# Patient Record
Sex: Male | Born: 1965 | Race: White | Hispanic: No | Marital: Married | Smoking: Never smoker
Health system: Southern US, Community
[De-identification: ages and names within clinical notes are randomized; demographics above are authoritative.]

## PROBLEM LIST (undated history)

## (undated) DIAGNOSIS — E785 Hyperlipidemia, unspecified: Secondary | ICD-10-CM

## (undated) DIAGNOSIS — I2510 Atherosclerotic heart disease of native coronary artery without angina pectoris: Secondary | ICD-10-CM

## (undated) DIAGNOSIS — N63 Unspecified lump in unspecified breast: Secondary | ICD-10-CM

## (undated) HISTORY — DX: Atherosclerotic heart disease of native coronary artery without angina pectoris: I25.10

## (undated) HISTORY — DX: Unspecified lump in unspecified breast: N63.0

## (undated) HISTORY — DX: Hyperlipidemia, unspecified: E78.5

## (undated) SURGERY — Surgical Case
Anesthesia: *Unknown

---
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
## 1998-04-17 ENCOUNTER — Ambulatory Visit (HOSPITAL_COMMUNITY): Admission: RE | Admit: 1998-04-17 | Discharge: 1998-04-17 | Payer: Self-pay | Admitting: Gastroenterology

## 2003-03-27 ENCOUNTER — Encounter: Admission: RE | Admit: 2003-03-27 | Discharge: 2003-05-03 | Payer: Self-pay | Admitting: Family Medicine

## 2004-02-26 ENCOUNTER — Encounter: Admission: RE | Admit: 2004-02-26 | Discharge: 2004-02-26 | Payer: Self-pay | Admitting: Internal Medicine

## 2005-04-19 IMAGING — US US EXTREM LOW VENOUS*R*
1 series · 14 of 24 positions shown · non-contrast
Comparison: none

CLINICAL DATA: Right leg swelling and pain.
 ULTRASOUND VENOUS IMAGING OF RIGHT LEG
 Ultrasound of the right leg deep venous system was performed.  The right common femoral vein, superficial femoral vein, saphenous, popliteal, and posterior tibial veins all compress and augment normally.  There is no evidence of deep venous thrombosis.
 IMPRESSION
 No evidence of DVT.

[Series 1: unknown · 14 of 34 slices shown]
[im 1/34]
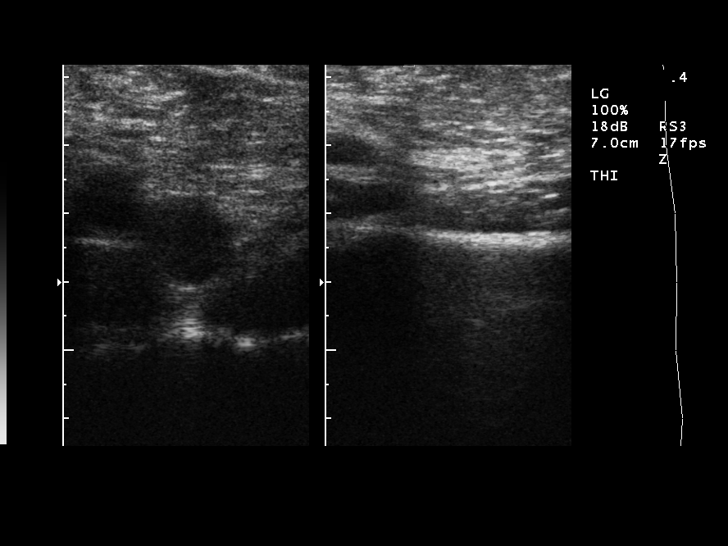
[im 3/34]
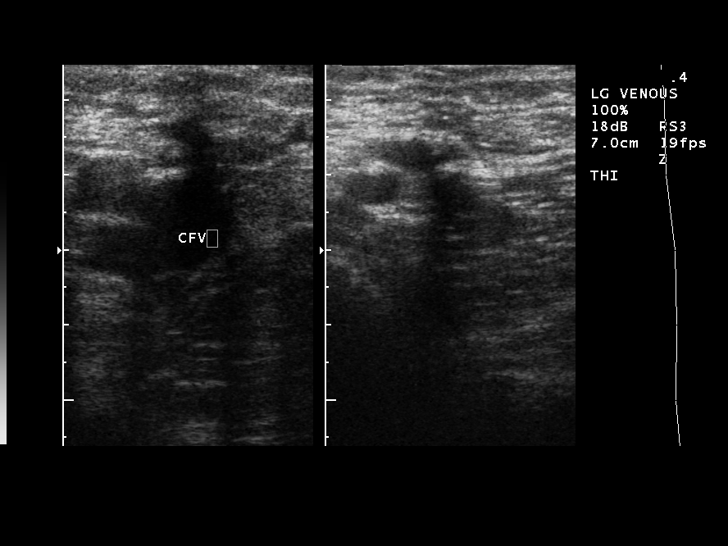
[im 6/34]
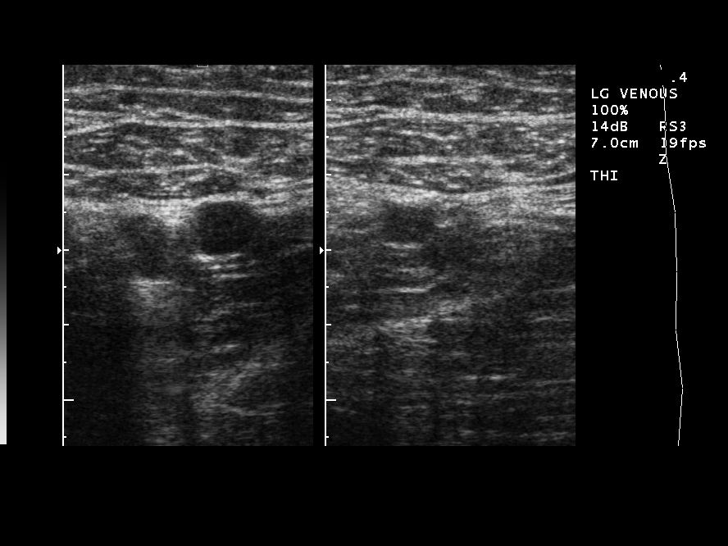
[im 9/34]
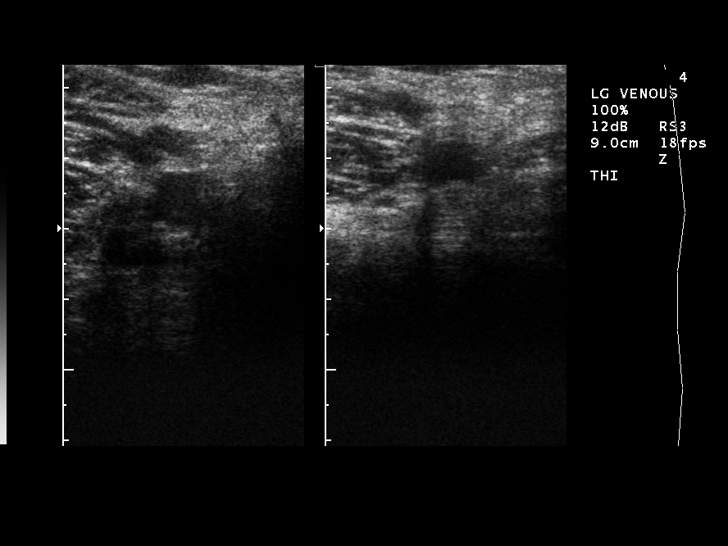
[im 11/34]
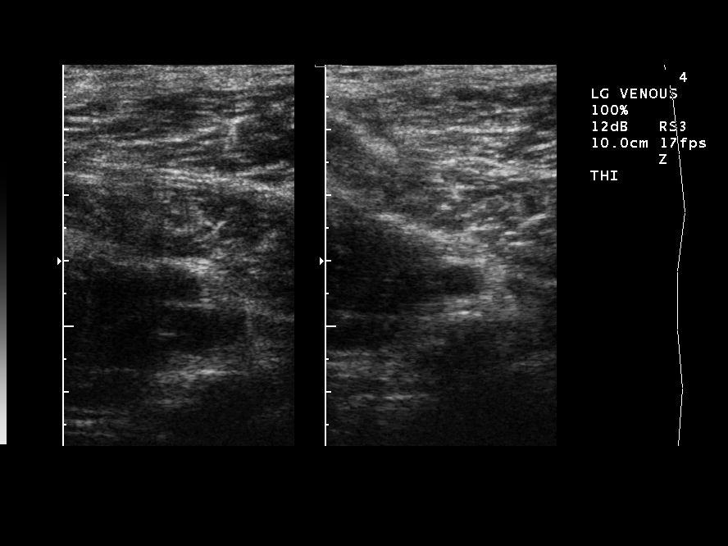
[im 13/34]
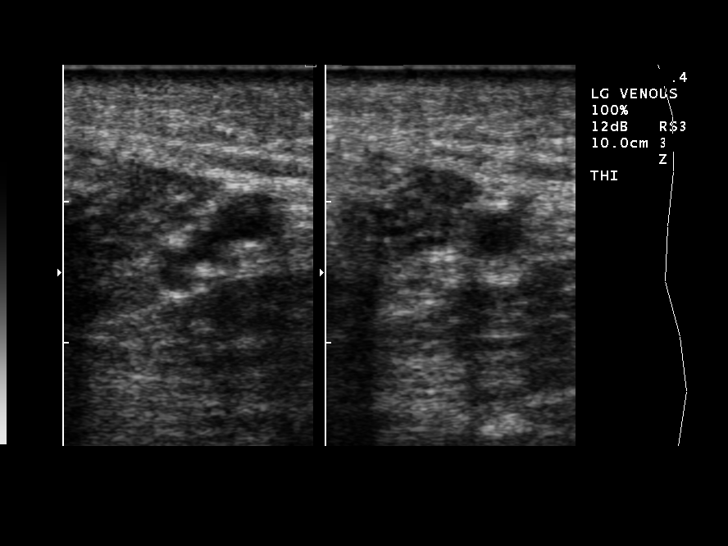
[im 16/34]
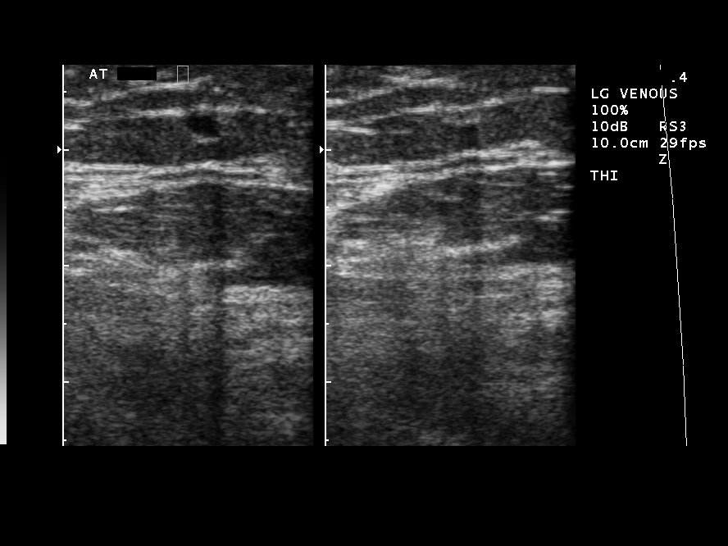
[im 18/34]
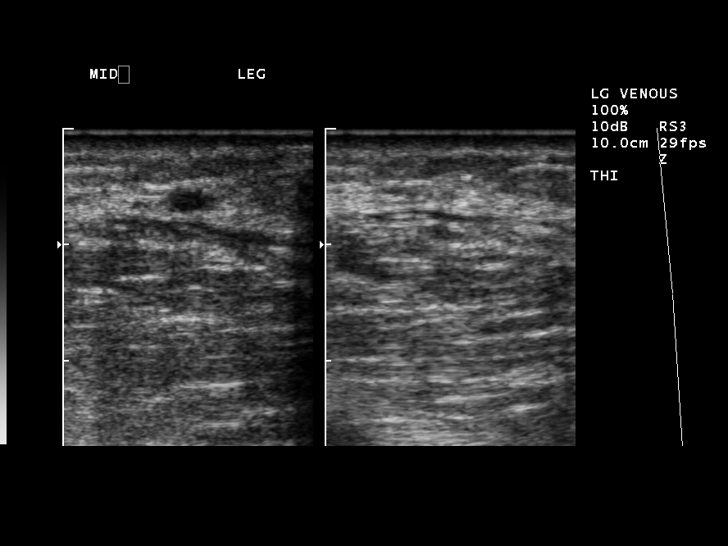
[im 21/34]
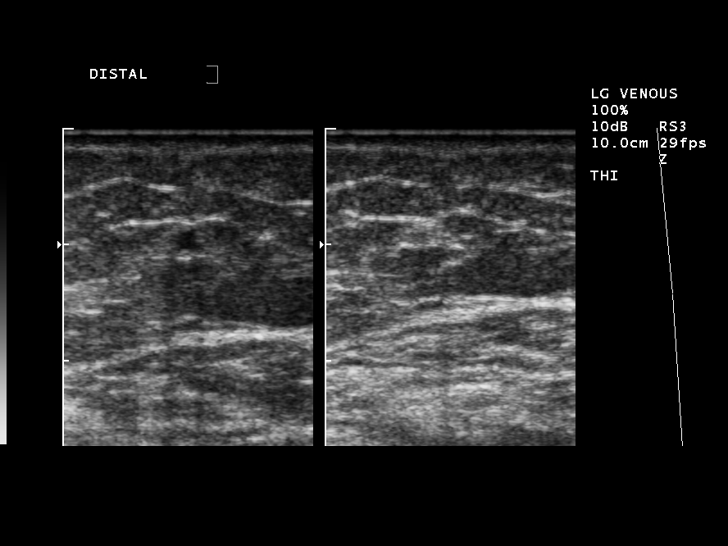
[im 23/34]
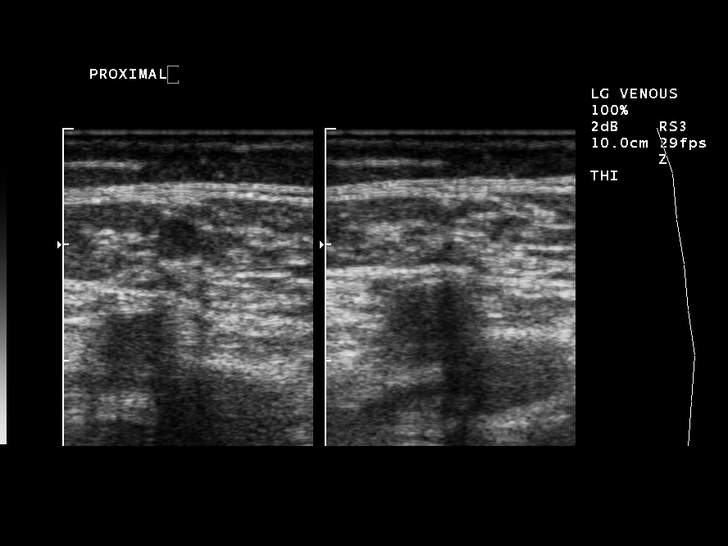
[im 26/34]
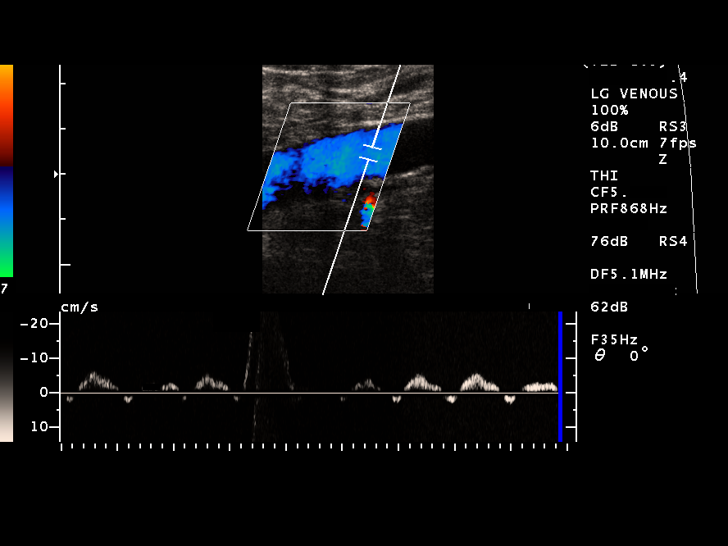
[im 28/34]
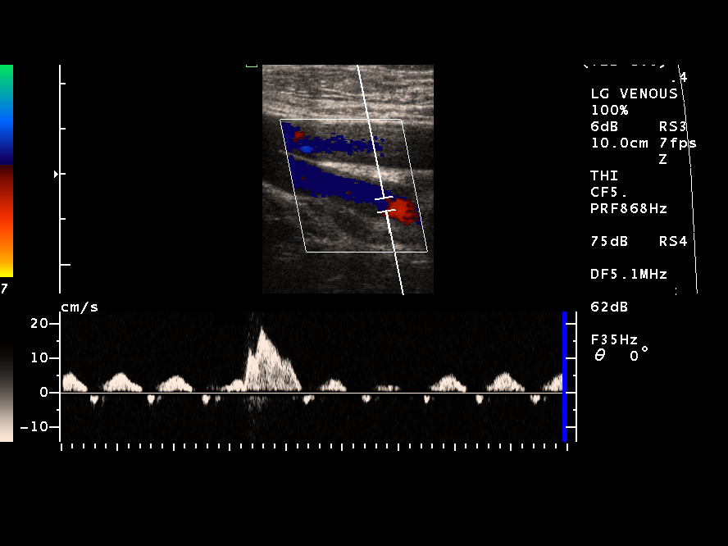
[im 31/34]
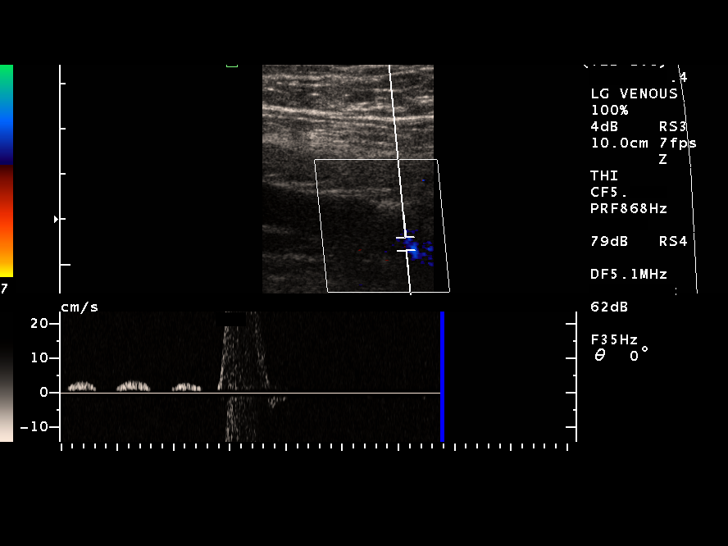
[im 34/34]
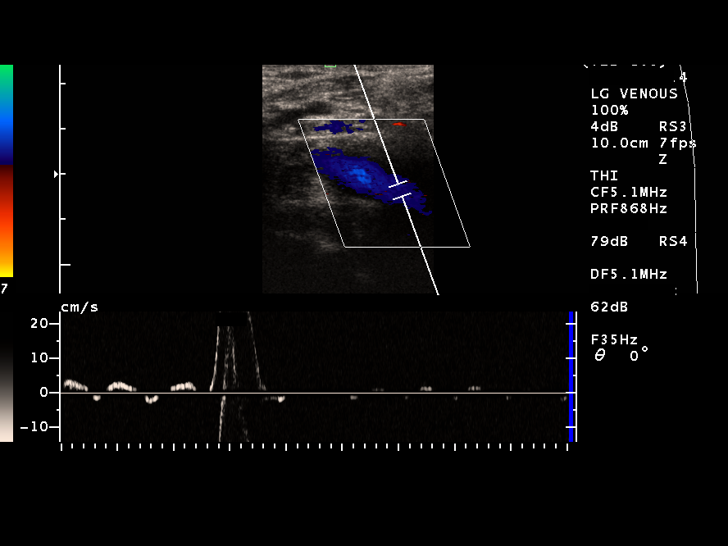

[14 of 24 positions shown; findings below may reference images not displayed]

## 2009-09-15 HISTORY — PX: GANGLION CYST EXCISION: SHX1691

## 2013-02-16 ENCOUNTER — Telehealth (INDEPENDENT_AMBULATORY_CARE_PROVIDER_SITE_OTHER): Payer: Self-pay

## 2013-02-16 NOTE — Telephone Encounter (Signed)
The pt called regarding his upcoming appointment on 6/19.  He was told to call back if he needs a referral for a ct or test.  He is self referred for a lump near his rib cage.  I told him we can't refer him for testing until we see him.  We don't know what the dr will want.  He needs to call his medical md to ask him that or he can wait and be seen here 1st.  We can go from there.  He agrees to wait.

## 2013-03-03 ENCOUNTER — Encounter (INDEPENDENT_AMBULATORY_CARE_PROVIDER_SITE_OTHER): Payer: Self-pay | Admitting: General Surgery

## 2013-03-03 ENCOUNTER — Ambulatory Visit (INDEPENDENT_AMBULATORY_CARE_PROVIDER_SITE_OTHER): Payer: 59 | Admitting: General Surgery

## 2013-03-03 VITALS — BP 148/100 | HR 72 | Temp 99.2°F | Resp 16 | Ht 67.0 in | Wt 186.0 lb

## 2013-03-03 DIAGNOSIS — D179 Benign lipomatous neoplasm, unspecified: Secondary | ICD-10-CM

## 2013-03-03 NOTE — Patient Instructions (Signed)

## 2013-03-03 NOTE — Progress Notes (Signed)
Chief complaint: Lump left chest  History: Patient come to the office self-referred, his mother a former patient of mine. For about 9 months he has noted a gradually enlarging lump over his left costal margin. It has enlarged minimally. It gives him mild discomfort occasionally when he lies against it. He's not sure it is changed much in the last couple of months. He has no other similar areas.  Past Medical History  Diagnosis Date  . Breast mass     left   Past Surgical History  Procedure Laterality Date  . Anterior cruciate ligament repair  1986    left  . Ganglion cyst excision  2011    right wrist   No current outpatient prescriptions on file.   No current facility-administered medications for this visit.   No Known Allergies  Exam: BP 148/100  Pulse 72  Temp(Src) 99.2 F (37.3 C) (Oral)  Resp 16  Ht 5\' 7"  (1.702 m)  Wt 186 lb (84.369 kg)  BMI 29.12 kg/m2 General: Healthy-appearing male no distress Lungs: Clear equal breath sounds Cardiac: Regular rate and rhythm. No murmurs. Chest wall: Overlying the anterior lateral left costal margin is approximately 4 x 2 cm soft discrete freely movable rubbery mass entirely consistent with a lipoma.  Assessment and plan: Lipoma left costal margin. This appears to be mildly symptomatic and slightly enlarged. We discussed options including excision or observation. I think he of course is reasonable. He will think this over. Right now he's not sure that is bothering him enough and I think observation is safe. I stressed to him that if he notices any definite enlargement or increasing discomfort he should return for evaluation.

## 2023-03-12 DIAGNOSIS — I213 ST elevation (STEMI) myocardial infarction of unspecified site: Secondary | ICD-10-CM

## 2023-03-12 HISTORY — DX: ST elevation (STEMI) myocardial infarction of unspecified site: I21.3

## 2023-03-13 ENCOUNTER — Encounter (HOSPITAL_BASED_OUTPATIENT_CLINIC_OR_DEPARTMENT_OTHER): Payer: Self-pay

## 2023-03-13 ENCOUNTER — Encounter (HOSPITAL_COMMUNITY)
Admission: EM | Disposition: A | Discharge status: Home / Self Care | Payer: Self-pay | Source: Home / Self Care | Attending: Cardiovascular Disease

## 2023-03-13 ENCOUNTER — Inpatient Hospital Stay (HOSPITAL_BASED_OUTPATIENT_CLINIC_OR_DEPARTMENT_OTHER)
Admission: EM | Admit: 2023-03-13 | Discharge: 2023-03-15 | DRG: 322 | Disposition: A | Discharge status: Home / Self Care | Payer: 59 | Attending: Cardiovascular Disease | Admitting: Cardiovascular Disease

## 2023-03-13 ENCOUNTER — Other Ambulatory Visit (HOSPITAL_COMMUNITY): Payer: Self-pay

## 2023-03-13 ENCOUNTER — Other Ambulatory Visit: Payer: Self-pay

## 2023-03-13 DIAGNOSIS — Z8249 Family history of ischemic heart disease and other diseases of the circulatory system: Secondary | ICD-10-CM | POA: Diagnosis not present

## 2023-03-13 DIAGNOSIS — Z955 Presence of coronary angioplasty implant and graft: Secondary | ICD-10-CM

## 2023-03-13 DIAGNOSIS — Z79899 Other long term (current) drug therapy: Secondary | ICD-10-CM | POA: Diagnosis not present

## 2023-03-13 DIAGNOSIS — I213 ST elevation (STEMI) myocardial infarction of unspecified site: Principal | ICD-10-CM

## 2023-03-13 DIAGNOSIS — I2510 Atherosclerotic heart disease of native coronary artery without angina pectoris: Secondary | ICD-10-CM | POA: Diagnosis not present

## 2023-03-13 DIAGNOSIS — I2119 ST elevation (STEMI) myocardial infarction involving other coronary artery of inferior wall: Secondary | ICD-10-CM | POA: Diagnosis present

## 2023-03-13 DIAGNOSIS — R079 Chest pain, unspecified: Secondary | ICD-10-CM | POA: Diagnosis not present

## 2023-03-13 DIAGNOSIS — I2111 ST elevation (STEMI) myocardial infarction involving right coronary artery: Secondary | ICD-10-CM | POA: Diagnosis not present

## 2023-03-13 HISTORY — PX: CORONARY/GRAFT ACUTE MI REVASCULARIZATION: CATH118305

## 2023-03-13 HISTORY — PX: LEFT HEART CATH AND CORONARY ANGIOGRAPHY: CATH118249

## 2023-03-13 LAB — CBC WITH DIFFERENTIAL/PLATELET
Abs Immature Granulocytes: 0.02 10*3/uL (ref 0.00–0.07)
Basophils Absolute: 0.1 10*3/uL (ref 0.0–0.1)
Basophils Relative: 1 %
Eosinophils Absolute: 0.2 10*3/uL (ref 0.0–0.5)
Eosinophils Relative: 2 %
HCT: 47.9 % (ref 39.0–52.0)
Hemoglobin: 17.2 g/dL — ABNORMAL HIGH (ref 13.0–17.0)
Immature Granulocytes: 0 %
Lymphocytes Relative: 28 %
Lymphs Abs: 2.9 10*3/uL (ref 0.7–4.0)
MCH: 32.1 pg (ref 26.0–34.0)
MCHC: 35.9 g/dL (ref 30.0–36.0)
MCV: 89.5 fL (ref 80.0–100.0)
Monocytes Absolute: 0.8 10*3/uL (ref 0.1–1.0)
Monocytes Relative: 8 %
Neutro Abs: 6.2 10*3/uL (ref 1.7–7.7)
Neutrophils Relative %: 61 %
Platelets: 306 10*3/uL (ref 150–400)
RBC: 5.35 MIL/uL (ref 4.22–5.81)
RDW: 11.7 % (ref 11.5–15.5)
WBC: 10.2 10*3/uL (ref 4.0–10.5)
nRBC: 0 % (ref 0.0–0.2)

## 2023-03-13 LAB — POCT ACTIVATED CLOTTING TIME
Activated Clotting Time: 415 seconds
Activated Clotting Time: 415 seconds

## 2023-03-13 LAB — COMPREHENSIVE METABOLIC PANEL
ALT: 23 U/L (ref 0–44)
AST: 27 U/L (ref 15–41)
Albumin: 4.7 g/dL (ref 3.5–5.0)
Alkaline Phosphatase: 67 U/L (ref 38–126)
Anion gap: 10 (ref 5–15)
BUN: 14 mg/dL (ref 6–20)
CO2: 27 mmol/L (ref 22–32)
Calcium: 10.1 mg/dL (ref 8.9–10.3)
Chloride: 102 mmol/L (ref 98–111)
Creatinine, Ser: 1.13 mg/dL (ref 0.61–1.24)
GFR, Estimated: >60 mL/min (ref >60)
Glucose, Bld: 148 mg/dL — ABNORMAL HIGH (ref 70–99)
Potassium: 3.8 mmol/L (ref 3.5–5.1)
Sodium: 139 mmol/L (ref 135–145)
Total Bilirubin: 0.7 mg/dL (ref 0.3–1.2)
Total Protein: 7.6 g/dL (ref 6.5–8.1)

## 2023-03-13 LAB — LIPID PANEL
Cholesterol: 225 mg/dL — ABNORMAL HIGH (ref 0–200)
HDL: 47 mg/dL (ref >40)
LDL Cholesterol: 153 mg/dL — ABNORMAL HIGH (ref 0–99)
Total CHOL/HDL Ratio: 4.8 RATIO
Triglycerides: 125 mg/dL (ref <150)
VLDL: 25 mg/dL (ref 0–40)

## 2023-03-13 LAB — APTT: aPTT: 29 seconds (ref 24–36)

## 2023-03-13 LAB — TROPONIN I (HIGH SENSITIVITY)
Troponin I (High Sensitivity): 306 ng/L (ref <18)
Troponin I (High Sensitivity): 481 ng/L (ref <18)

## 2023-03-13 LAB — PROTIME-INR
INR: 1.1 (ref 0.8–1.2)
Prothrombin Time: 14.2 seconds (ref 11.4–15.2)

## 2023-03-13 LAB — MRSA NEXT GEN BY PCR, NASAL: MRSA by PCR Next Gen: NOT DETECTED

## 2023-03-13 SURGERY — CORONARY/GRAFT ACUTE MI REVASCULARIZATION
Anesthesia: LOCAL

## 2023-03-13 MED ORDER — PRASUGREL HCL 10 MG PO TABS
60.0000 mg | ORAL_TABLET | Freq: Once | ORAL | Status: AC
  Administered 2023-03-13: 60 mg via ORAL
  Filled 2023-03-13: qty 6

## 2023-03-13 MED ORDER — HEPARIN SODIUM (PORCINE) 5000 UNIT/ML IJ SOLN
4000.0000 [IU] | Freq: Once | INTRAMUSCULAR | Status: AC
  Administered 2023-03-13: 4000 [IU] via INTRAVENOUS
  Filled 2023-03-13: qty 1

## 2023-03-13 MED ORDER — MORPHINE SULFATE (PF) 2 MG/ML IV SOLN
2.0000 mg | INTRAVENOUS | Status: DC | PRN
  Administered 2023-03-13: 2 mg via INTRAVENOUS
  Filled 2023-03-13: qty 1

## 2023-03-13 MED ORDER — SODIUM CHLORIDE 0.9 % IV SOLN: INTRAVENOUS | Status: DC

## 2023-03-13 MED ORDER — TIROFIBAN HCL IN NACL 5-0.9 MG/100ML-% IV SOLN
0.1500 ug/kg/min | INTRAVENOUS | Status: AC
  Filled 2023-03-13: qty 100

## 2023-03-13 MED ORDER — MORPHINE SULFATE (PF) 4 MG/ML IV SOLN
4.0000 mg | Freq: Once | INTRAVENOUS | Status: AC
  Administered 2023-03-13: 4 mg via INTRAVENOUS
  Filled 2023-03-13: qty 1

## 2023-03-13 MED ORDER — ATORVASTATIN CALCIUM 80 MG PO TABS
80.0000 mg | ORAL_TABLET | Freq: Every day | ORAL | Status: DC
  Administered 2023-03-13 – 2023-03-14 (×2): 80 mg via ORAL
  Filled 2023-03-13 (×2): qty 1

## 2023-03-13 MED ORDER — TICAGRELOR 90 MG PO TABS
ORAL_TABLET | ORAL | Status: AC
  Filled 2023-03-13: qty 2

## 2023-03-13 MED ORDER — HYDRALAZINE HCL 20 MG/ML IJ SOLN: 10.0000 mg | INTRAMUSCULAR | Status: AC | PRN

## 2023-03-13 MED ORDER — MIDAZOLAM HCL 2 MG/2ML IJ SOLN
INTRAMUSCULAR | Status: DC | PRN
  Administered 2023-03-13: 2 mg via INTRAVENOUS

## 2023-03-13 MED ORDER — ACETAMINOPHEN 325 MG PO TABS: 650.0000 mg | ORAL_TABLET | ORAL | Status: DC | PRN

## 2023-03-13 MED ORDER — SODIUM CHLORIDE 0.9 % IV SOLN: 250.0000 mL | INTRAVENOUS | Status: DC | PRN

## 2023-03-13 MED ORDER — IOHEXOL 350 MG/ML SOLN
INTRAVENOUS | Status: DC | PRN
  Administered 2023-03-13: 80 mL

## 2023-03-13 MED ORDER — CHLORHEXIDINE GLUCONATE CLOTH 2 % EX PADS
6.0000 | MEDICATED_PAD | Freq: Every day | CUTANEOUS | Status: DC
  Administered 2023-03-13 – 2023-03-14 (×2): 6 via TOPICAL

## 2023-03-13 MED ORDER — HEPARIN (PORCINE) IN NACL 1000-0.9 UT/500ML-% IV SOLN
INTRAVENOUS | Status: DC | PRN
  Administered 2023-03-13 (×2): 500 mL

## 2023-03-13 MED ORDER — LIDOCAINE HCL (PF) 1 % IJ SOLN
INTRAMUSCULAR | Status: DC | PRN
  Administered 2023-03-13: 2 mL

## 2023-03-13 MED ORDER — TIROFIBAN (AGGRASTAT) BOLUS VIA INFUSION
INTRAVENOUS | Status: DC | PRN
  Administered 2023-03-13: 2075 ug via INTRAVENOUS

## 2023-03-13 MED ORDER — VERAPAMIL HCL 2.5 MG/ML IV SOLN
INTRAVENOUS | Status: AC
  Filled 2023-03-13: qty 2

## 2023-03-13 MED ORDER — TICAGRELOR 90 MG PO TABS: 90.0000 mg | ORAL_TABLET | Freq: Two times a day (BID) | ORAL | Status: DC

## 2023-03-13 MED ORDER — HEPARIN SODIUM (PORCINE) 1000 UNIT/ML IJ SOLN
INTRAMUSCULAR | Status: DC | PRN
  Administered 2023-03-13: 10000 [IU] via INTRAVENOUS

## 2023-03-13 MED ORDER — LABETALOL HCL 5 MG/ML IV SOLN: 10.0000 mg | INTRAVENOUS | Status: AC | PRN

## 2023-03-13 MED ORDER — FENTANYL CITRATE (PF) 100 MCG/2ML IJ SOLN
INTRAMUSCULAR | Status: AC
  Filled 2023-03-13: qty 2

## 2023-03-13 MED ORDER — VERAPAMIL HCL 2.5 MG/ML IV SOLN
INTRAVENOUS | Status: DC | PRN
  Administered 2023-03-13: 10 mL via INTRA_ARTERIAL

## 2023-03-13 MED ORDER — TIROFIBAN HCL IN NACL 5-0.9 MG/100ML-% IV SOLN
INTRAVENOUS | Status: AC
  Filled 2023-03-13: qty 100

## 2023-03-13 MED ORDER — MIDAZOLAM HCL 2 MG/2ML IJ SOLN
INTRAMUSCULAR | Status: AC
  Filled 2023-03-13: qty 2

## 2023-03-13 MED ORDER — HEPARIN SODIUM (PORCINE) 1000 UNIT/ML IJ SOLN
INTRAMUSCULAR | Status: AC
  Filled 2023-03-13: qty 10

## 2023-03-13 MED ORDER — TICAGRELOR 90 MG PO TABS
ORAL_TABLET | ORAL | Status: DC | PRN
  Administered 2023-03-13: 180 mg via ORAL

## 2023-03-13 MED ORDER — OXYCODONE HCL 5 MG PO TABS: 5.0000 mg | ORAL_TABLET | ORAL | Status: DC | PRN

## 2023-03-13 MED ORDER — ASPIRIN 81 MG PO CHEW
324.0000 mg | CHEWABLE_TABLET | Freq: Once | ORAL | Status: AC
  Administered 2023-03-13: 324 mg via ORAL
  Filled 2023-03-13: qty 4

## 2023-03-13 MED ORDER — LIDOCAINE HCL (PF) 1 % IJ SOLN
INTRAMUSCULAR | Status: AC
  Filled 2023-03-13: qty 30

## 2023-03-13 MED ORDER — SODIUM CHLORIDE 0.9% FLUSH: 3.0000 mL | INTRAVENOUS | Status: DC | PRN

## 2023-03-13 MED ORDER — ASPIRIN 81 MG PO CHEW
81.0000 mg | CHEWABLE_TABLET | Freq: Every day | ORAL | Status: DC
  Administered 2023-03-14: 81 mg via ORAL
  Filled 2023-03-13: qty 1

## 2023-03-13 MED ORDER — PRASUGREL HCL 10 MG PO TABS
10.0000 mg | ORAL_TABLET | Freq: Every day | ORAL | Status: DC
  Administered 2023-03-14: 10 mg via ORAL
  Filled 2023-03-13 (×2): qty 1

## 2023-03-13 MED ORDER — TIROFIBAN HCL IN NACL 5-0.9 MG/100ML-% IV SOLN
INTRAVENOUS | Status: DC | PRN
  Administered 2023-03-13: .15 ug/kg/min via INTRAVENOUS

## 2023-03-13 MED ORDER — FENTANYL CITRATE (PF) 100 MCG/2ML IJ SOLN
INTRAMUSCULAR | Status: DC | PRN
  Administered 2023-03-13: 50 ug via INTRAVENOUS

## 2023-03-13 MED ORDER — SODIUM CHLORIDE 0.9 % IV SOLN: INTRAVENOUS | Status: AC

## 2023-03-13 MED ORDER — ONDANSETRON HCL 4 MG/2ML IJ SOLN: 4.0000 mg | Freq: Four times a day (QID) | INTRAMUSCULAR | Status: DC | PRN

## 2023-03-13 MED ORDER — ORAL CARE MOUTH RINSE: 15.0000 mL | OROMUCOSAL | Status: DC | PRN

## 2023-03-13 MED ORDER — SODIUM CHLORIDE 0.9% FLUSH
3.0000 mL | Freq: Two times a day (BID) | INTRAVENOUS | Status: DC
  Administered 2023-03-14 (×2): 3 mL via INTRAVENOUS

## 2023-03-13 SURGICAL SUPPLY — 19 items
BALLN EMERGE MR 2.5X12 (BALLOONS) ×1
BALLN ~~LOC~~ EMERGE MR 3.0X20 (BALLOONS) ×1
BALLOON EMERGE MR 2.5X12 (BALLOONS) IMPLANT
BALLOON ~~LOC~~ EMERGE MR 3.0X20 (BALLOONS) IMPLANT
CATH INFINITI 5 FR JL3.5 (CATHETERS) IMPLANT
CATH INFINITI 5FR ANG PIGTAIL (CATHETERS) IMPLANT
CATH LAUNCHER 6FR JR4 (CATHETERS) IMPLANT
DEVICE RAD COMP TR BAND LRG (VASCULAR PRODUCTS) IMPLANT
GLIDESHEATH SLEND SS 6F .021 (SHEATH) IMPLANT
GUIDEWIRE INQWIRE 1.5J.035X260 (WIRE) IMPLANT
INQWIRE 1.5J .035X260CM (WIRE) ×1
KIT ENCORE 26 ADVANTAGE (KITS) IMPLANT
KIT HEART LEFT (KITS) ×1 IMPLANT
PACK CARDIAC CATHETERIZATION (CUSTOM PROCEDURE TRAY) ×1 IMPLANT
STENT SYNERGY XD 2.75X38 (Permanent Stent) IMPLANT
SYNERGY XD 2.75X38 (Permanent Stent) ×1 IMPLANT
TRANSDUCER W/STOPCOCK (MISCELLANEOUS) ×1 IMPLANT
TUBING CIL FLEX 10 FLL-RA (TUBING) ×1 IMPLANT
WIRE COUGAR XT STRL 190CM (WIRE) IMPLANT

## 2023-03-13 NOTE — ED Notes (Addendum)
Pt placed on 2L [place] per cath lab.

## 2023-03-13 NOTE — TOC Initial Note (Signed)
Transition of Care [facility]) - Initial/Assessment Note    Patient Details  Name: Brent Carr MRN: [id number] Date of Birth: [birth year]  [facility]) CM/SW Contact:    Elliot Cousin, RN Phone Number: [phone] 03/13/2023, 4:32 PM  Clinical Narrative:  TOC CM spoke to pt and wife at bedside. Pt wanted to scheduled new PCP at [facility]. Contacted office and left message with new patient coordinator to schedule appt. Pt states he does not need note for work.                  Expected Discharge Plan: Home/Self Care Barriers to Discharge: Continued Medical Work up   Patient Goals and CMS Choice Patient states their goals for this hospitalization and ongoing recovery are:: wants to recover          Expected Discharge Plan and Services   Discharge Planning Services: CM Consult   Living arrangements for the past 2 months: Single Family Home                                      Prior Living Arrangements/Services Living arrangements for the past 2 months: Single Family Home Lives with:: Spouse Patient language and need for interpreter reviewed:: Yes Do you feel safe going back to the place where you live?: Yes      Need for Family Participation in Patient Care: No (Comment) Care giver support system in place?: Yes (comment)   Criminal Activity/Legal Involvement Pertinent to Current Situation/Hospitalization: No - Comment as needed  Activities of Daily Living Home Assistive Devices/Equipment: Environmental consultant (specify type), Wheelchair, Shower chair with back (Rolator and regular walker) ADL Screening (condition at time of admission) Patient's cognitive ability adequate to safely complete daily activities?: Yes Is the patient deaf or have difficulty hearing?: No Does the patient have difficulty seeing, even when wearing glasses/contacts?: No Does the patient have difficulty concentrating, remembering, or making decisions?: No Patient able to  express need for assistance with ADLs?: Yes Does the patient have difficulty dressing or bathing?: No Independently performs ADLs?: Yes (appropriate for developmental age) Does the patient have difficulty walking or climbing stairs?: No Weakness of Legs: None Weakness of Arms/Hands: None  Permission Sought/Granted Permission sought to share information with : Case Manager, Family Supports Permission granted to share information with : Yes, Verbal Permission Granted  Share Information with NAME: Erich Ricke     Permission granted to share info w Relationship: wife  Permission granted to share info w Contact Information: [phone]  Emotional Assessment Appearance:: Appears stated age Attitude/Demeanor/Rapport: Engaged   Orientation: : Oriented to Self, Oriented to Place, Oriented to  Time, Oriented to Situation   Psych Involvement: No (comment)  Admission diagnosis:  ST elevation myocardial infarction (STEMI), unspecified artery (HCC) [I21.3] Acute ST elevation myocardial infarction (STEMI) of inferior wall (HCC) [I21.19] Patient Active Problem List   Diagnosis Date Noted   Acute ST elevation myocardial infarction (STEMI) of inferior wall (HCC) 03/13/2023   PCP:  Pcp, No Pharmacy:   [id number] [place], [place] - [address] [place] [address] Phone: [phone] Fax: [phone]     Social Determinants of Health (SDOH) Social History: SDOH Screenings   Food Insecurity: No Food Insecurity (03/13/2023)  Housing: Low Risk  (03/13/2023)  Transportation Needs: No Transportation Needs (03/13/2023)  Utilities: Not At Risk (03/13/2023)  Tobacco Use: Unknown (  03/13/2023)   SDOH Interventions:     Readmission Risk Interventions     No data to display

## 2023-03-13 NOTE — ED Notes (Signed)
RT placed patient on 2lpm [place] , orders from physician. Patient saturation 100% 2lpm [place].

## 2023-03-13 NOTE — H&P (Addendum)
Cardiology Admission History and Physical   Patient ID: Brent Carr MRN: [id number]; DOB: Oct 08, 1965   Admission date: 03/13/2023  PCP:  Oneita Hurt, No   [facility] Providers Cardiologist:  None        Chief Complaint:  chest pain  Patient Profile:   Brent Carr is a 57 y.o. male with no significant medical history who is being seen 03/13/2023 for the evaluation of inferior STEMI.  History of Present Illness:   Mr. Valenzano presented to [facility] ED this morning for evaluation of chest pain that first began this past Tuesday 6/25. Pain was initially intermittent but became persistent and more severe this morning with radiation to the left shoulder and arm. Pain this morning occurred while at rest and severity was enough to prompt evaluation in the ED.   Initial ECG at [facility] showed ST elevation in leads II, III, AVF with ST segment depression in high latera leads I and AVL. STEMI alert called and patient transferred to [facility] for urgent LHC. Prior to departure from [facility], patient received heparin bolus, morphine, and ASA.   Upon arrival to the cath lab, patient with resolution of ST segment changes and is currently chest pain free. A brief review of history with patient confirms no major medical history. Has had prior ACL reconstruction. Patient does report significant family hx CAD with both his brother and father s/p CABG.    Past Medical History:  Diagnosis Date   Breast mass    left    Past Surgical History:  Procedure Laterality Date   ANTERIOR CRUCIATE LIGAMENT REPAIR  1986   left   GANGLION CYST EXCISION  2011   right wrist     Medications Prior to Admission: Prior to Admission medications   Not on File     Allergies:   No Known Allergies  Social History:   Social History   Socioeconomic History   Marital status: Married    Spouse name: Not on file   Number of children: Not on file   Years of education: Not on file   Highest education  level: Not on file  Occupational History   Not on file  Tobacco Use   Smoking status: Never   Smokeless tobacco: Not on file  Substance and Sexual Activity   Alcohol use: No   Drug use: No   Sexual activity: Not on file  Other Topics Concern   Not on file  Social History Narrative   Not on file   Social Determinants of Health   Financial Resource Strain: Not on file  Food Insecurity: Not on file  Transportation Needs: Not on file  Physical Activity: Not on file  Stress: Not on file  Social Connections: Not on file  Intimate Partner Violence: Not on file    Family History:  The patient's family history includes Cancer in his mother; Heart disease in his brother and father.    ROS:  Please see the history of present illness.  All other ROS reviewed and negative.     Physical Exam/Data:   Vitals:   03/13/23 1150 03/13/23 1156 03/13/23 1200 03/13/23 1236  BP: (!) 161/98  (!) 165/102   Pulse: 75  72   Resp: 15  12   Temp:      TempSrc:      SpO2: 98% 100% 100% 99%  Weight:      Height:       No intake or output data in the  24 hours ending 03/13/23 1301    03/13/2023   11:35 AM 03/03/2013    9:15 AM  Last 3 Weights  Weight (lbs) 182 lb 14.4 oz 186 lb  Weight (kg) 82.963 kg 84.369 kg     Body mass index is 28.65 kg/m.   Physical exam per attending provider.   EKG:  The ECG that was done in triage at [facility] was personally reviewed and demonstrates ST segment elevation leads II, III, AVF with reciprocal depression in lead I and AVL. Subsequent ECG with [facility] shows resolution of ST segment elevation.  Relevant CV Studies: N/A  Laboratory Data:  High Sensitivity Troponin:   Recent Labs  Lab 03/13/23 1137  TROPONINIHS 306*      Chemistry Recent Labs  Lab 03/13/23 1137  NA 139  K 3.8  CL 102  CO2 27  GLUCOSE 148*  BUN 14  CREATININE 1.13  CALCIUM 10.1  GFRNONAA >60  ANIONGAP 10    Recent Labs  Lab 03/13/23 1137  PROT 7.6  ALBUMIN 4.7  AST  27  ALT 23  ALKPHOS 67  BILITOT 0.7   Lipids No results for input(s): "CHOL", "TRIG", "HDL", "LABVLDL", "LDLCALC", "CHOLHDL" in the last 168 hours. Hematology Recent Labs  Lab 03/13/23 1137  WBC 10.2  RBC 5.35  HGB 17.2*  HCT 47.9  MCV 89.5  MCH 32.1  MCHC 35.9  RDW 11.7  PLT 306   Thyroid No results for input(s): "TSH", "FREET4" in the last 168 hours. BNPNo results for input(s): "BNP", "PROBNP" in the last 168 hours.  DDimer No results for input(s): "DDIMER" in the last 168 hours.   Radiology/Studies:  No results found.   Assessment and Plan:   Inferior STEMI  Patient with intermittent chest pain starting Tuesday 6/25. This morning, pain became persistent and radiated into left arm prompting ED visit. Triage ECG with ST segment elevation in inferior leads II, III, AVF and reciprocal depression in I and AVL. Patient received heparin, morphine, and ASA and STEMI alert activated.   Upon arrival to the cath lab, patient chest pain free with resolution of ST elevation.  Urgent LHC with Dr. Clifton James found mid-RCA with 99% occlusion. DES successfully placed.  Patient loaded on Brilinta, will continue with daily DAPT Brilinta/ASA. Aggrastat x2 hours post intervention Initiate high intensity statin. Check lipid panel and lipoprotein A Check A1c Echocardiogram ordered   Risk Assessment/Risk Scores:    TIMI Risk Score for ST  Elevation MI:   The patient's TIMI risk score is 1, which indicates a 1.6% risk of all cause mortality at 30 days.        Severity of Illness: The appropriate patient status for this patient is OBSERVATION. Observation status is judged to be reasonable and necessary in order to provide the required intensity of service to ensure the patient's safety. The patient's presenting symptoms, physical exam findings, and initial radiographic and laboratory data in the context of their medical condition is felt to place them at decreased risk for further  clinical deterioration. Furthermore, it is anticipated that the patient will be medically stable for discharge from the hospital within 2 midnights of admission.    For questions or updates, please contact [facility] Please consult [email or web address] for contact info under     Signed, Perlie Gold, PA-C  03/13/2023 1:01 PM   I have personally seen and examined this patient. I agree with the assessment and plan as outlined above.  57 yo male with no prior  medical history who came into the ED with chest pain. EKG with inferior ST elevation. Code STEMI called. Cath with severe distal RCA stenosis. One stent placed in the distal RCA.  Admit to ICU. DAPT with ASA/Brilinta for one year. High intensity statin.  Echo tomorrow. Beta blocker if LVEF down.   Verne Carrow, MD, [facility] 03/13/2023 1:32 PM

## 2023-03-13 NOTE — ED Triage Notes (Signed)
Onset Tuesday night chest pain off and on.  Constantan pain to mid chest chest.  Denies SOB

## 2023-03-13 NOTE — ED Provider Notes (Signed)
[place] EMERGENCY DEPARTMENT AT [facility] Provider Note   CSN: [id number] Arrival date & time: 03/13/23  1125     History  Chief Complaint  Patient presents with   Chest Pain    Brent Carr is a 57 y.o. male.  Patient is a 57 year old male with no known past medical history presenting to the emergency department with chest pain.  Patient states that he has had on and off midsternal chest pressure since Tuesday.  He states when he woke up this morning his pain was worse than usual and was radiating into his left shoulder and arm which prompted him to come to the emergency department.  He states that he was at rest when the pain started this morning.  He denies any associated nausea, vomiting or diaphoresis.  He denies any tobacco, alcohol or drug use.  The history is provided by the patient and the spouse.  Chest Pain      Home Medications Prior to Admission medications   Not on File      Allergies    Patient has no known allergies.    Review of Systems   Review of Systems  Cardiovascular:  Positive for chest pain.    Physical Exam Updated Vital Signs BP (!) 161/98   Pulse 75   Temp (!) 97.5 F (36.4 C) (Oral)   Resp 15   Ht 5\' 7"  (1.702 m)   Wt 83 kg   SpO2 100%   BMI 28.65 kg/m  Physical Exam Vitals and nursing note reviewed.  Constitutional:      General: He is not in acute distress.    Appearance: He is well-developed.  HENT:     Head: Normocephalic and atraumatic.  Eyes:     Extraocular Movements: Extraocular movements intact.  Cardiovascular:     Rate and Rhythm: Normal rate and regular rhythm.     Pulses:          Radial pulses are 2+ on the right side and 2+ on the left side.     Heart sounds: Normal heart sounds.  Pulmonary:     Effort: Pulmonary effort is normal.     Breath sounds: Normal breath sounds.  Abdominal:     Palpations: Abdomen is soft.     Tenderness: There is no abdominal tenderness.  Musculoskeletal:         General: Normal range of motion.     Cervical back: Normal range of motion and neck supple.     Right lower leg: No edema.     Left lower leg: No edema.  Skin:    General: Skin is warm and dry.  Neurological:     General: No focal deficit present.     Mental Status: He is alert and oriented to person, place, and time.  Psychiatric:        Mood and Affect: Mood normal.        Behavior: Behavior normal.     ED Results / Procedures / Treatments   Labs (all labs ordered are listed, but only abnormal results are displayed) Labs Reviewed  CBC WITH DIFFERENTIAL/PLATELET - Abnormal; Notable for the following components:      Result Value   Hemoglobin 17.2 (*)    All other components within normal limits  COMPREHENSIVE METABOLIC PANEL - Abnormal; Notable for the following components:   Glucose, Bld 148 (*)    All other components within normal limits  PROTIME-INR  APTT  LIPID PANEL  I-STAT  CG4 LACTIC ACID, ED  TROPONIN I (HIGH SENSITIVITY)    EKG EKG Interpretation Date/Time:  Friday March 13 2023 11:32:44 EDT Ventricular Rate:  70 PR Interval:  163 QRS Duration:  90 QT Interval:  360 QTC Calculation: 389 R Axis:   67  Text Interpretation: Sinus rhythm Inferior infarct, acute (RCA) Lateral leads are also involved Probable RV involvement, suggest recording right precordial leads >>> Acute MI <<< Confirmed by Elayne Snare ([id number]) on 03/13/2023 11:55:49 AM  Radiology No results found.  Procedures .Critical Care  Performed by: Rexford Maus, DO Authorized by: Rexford Maus, DO   Critical care provider statement:    Critical care time (minutes):  35   Critical care time was exclusive of:  Separately billable procedures and treating other patients   Critical care was necessary to treat or prevent imminent or life-threatening deterioration of the following conditions:  Cardiac failure   Critical care was time spent personally by me on the following  activities:  Development of treatment plan with patient or surrogate, discussions with consultants, evaluation of patient's response to treatment, examination of patient, obtaining history from patient or surrogate, ordering and performing treatments and interventions, ordering and review of laboratory studies, ordering and review of radiographic studies, pulse oximetry, re-evaluation of patient's condition and review of old charts     Medications Ordered in ED Medications  0.9 %  sodium chloride infusion (has no administration in time range)  aspirin chewable tablet 324 mg (324 mg Oral Given 03/13/23 1139)  heparin injection 4,000 Units (4,000 Units Intravenous Given 03/13/23 1140)  morphine (PF) 4 MG/ML injection 4 mg (4 mg Intravenous Given 03/13/23 1142)    ED Course/ Medical Decision Making/ A&P Clinical Course as of 03/13/23 1213  Fri Mar 13, 2023  1146 I spoke with Dr. Clifton James with cardiology who agreed with STEMI alert and recommended emergent transport to cath lab. [VK]    Clinical Course User Index [VK] Rexford Maus, DO                             Medical Decision Making This patient presents to the ED with chief complaint(s) of chest pain with no pertinent past medical history which further complicates the presenting complaint. The complaint involves an extensive differential diagnosis and also carries with it a high risk of complications and morbidity.    The differential diagnosis includes ACS, arrhythmia, gastritis, GERD, pulmonary edema, pleural effusion, pneumothorax, considering dissection but equal pulses in all extremities without neurologic deficits making this less likely  Additional history obtained: Additional history obtained from spouse Records reviewed urgent care records  ED Course and Reassessment: On patient's arrival he is hypertensive to the 180s otherwise hemodynamically stable in no acute distress.  EKG was performed immediately on arrival showed  concern for STEMI with inferior ST elevations and lateral ST depressions.  I immediately evaluated the patient at bedside he is otherwise stable appearing.  STEMI alert was called.  The patient was given heparin bolus, morphine for pain and aspirin.  The patient was placed on telemetry and ZOLL monitor.  Independent labs interpretation:  -Within normal range, troponin pending  Independent visualization of imaging: - N/A  Consultation: - Consulted or discussed management/test interpretation w/ external professional: N/A  Consideration for admission or further workup: patient requires emergent transfer to [facility] for Cath lab/cardiology evaluation Social Determinants of health: N/A    Amount and/or Complexity of Data  Reviewed Labs: ordered.  Risk OTC drugs. Prescription drug management.          Final Clinical Impression(s) / ED Diagnoses Final diagnoses:  ST elevation myocardial infarction (STEMI), unspecified artery [facility])    Rx / DC Orders ED Discharge Orders     None         Rexford Maus, DO 03/13/23 1213

## 2023-03-13 NOTE — ED Notes (Signed)
Dr Theresia Lo at bedside.  Code Stemi called

## 2023-03-13 NOTE — ED Notes (Signed)
When asked , the patient stated he is not having any SOB or increased WOB. Oxygen saturation while on room air at rest=99%

## 2023-03-13 NOTE — ED Notes (Signed)
Handoff report given to [facility] 

## 2023-03-13 NOTE — TOC Benefit Eligibility Note (Signed)
Pharmacy Patient Advocate Encounter  Insurance verification completed.    The patient is insured through [employer] test claim for Brilinta 90 mg and Product Not Covered, must use Plavix or Effient first.   This test claim was processed through [employer]- copay amounts may vary at other pharmacies due to [employer], or as the patient moves through the different stages of their insurance plan.    Roland Earl, CPHT Pharmacy Patient Advocate Specialist [facility] Pharm[facility] Patient Advocate Team Direct Number: [phone]  Fax: [phone]

## 2023-03-13 NOTE — ED Notes (Signed)
[facility] at bedside 

## 2023-03-14 ENCOUNTER — Other Ambulatory Visit (HOSPITAL_COMMUNITY): Payer: Self-pay

## 2023-03-14 ENCOUNTER — Inpatient Hospital Stay (HOSPITAL_COMMUNITY): Payer: 59

## 2023-03-14 DIAGNOSIS — R079 Chest pain, unspecified: Secondary | ICD-10-CM

## 2023-03-14 DIAGNOSIS — I2119 ST elevation (STEMI) myocardial infarction involving other coronary artery of inferior wall: Secondary | ICD-10-CM

## 2023-03-14 LAB — CBC
HCT: 45.7 % (ref 39.0–52.0)
Hemoglobin: 15.6 g/dL (ref 13.0–17.0)
MCH: 31 pg (ref 26.0–34.0)
MCHC: 34.1 g/dL (ref 30.0–36.0)
MCV: 90.9 fL (ref 80.0–100.0)
Platelets: 262 10*3/uL (ref 150–400)
RBC: 5.03 MIL/uL (ref 4.22–5.81)
RDW: 11.7 % (ref 11.5–15.5)
WBC: 15.3 10*3/uL — ABNORMAL HIGH (ref 4.0–10.5)
nRBC: 0 % (ref 0.0–0.2)

## 2023-03-14 LAB — BASIC METABOLIC PANEL
Anion gap: 9 (ref 5–15)
BUN: 12 mg/dL (ref 6–20)
CO2: 21 mmol/L — ABNORMAL LOW (ref 22–32)
Calcium: 8.9 mg/dL (ref 8.9–10.3)
Chloride: 104 mmol/L (ref 98–111)
Creatinine, Ser: 1.06 mg/dL (ref 0.61–1.24)
GFR, Estimated: >60 mL/min (ref >60)
Glucose, Bld: 106 mg/dL — ABNORMAL HIGH (ref 70–99)
Potassium: 3.9 mmol/L (ref 3.5–5.1)
Sodium: 134 mmol/L — ABNORMAL LOW (ref 135–145)

## 2023-03-14 LAB — ECHOCARDIOGRAM COMPLETE
Area-P 1/2: 3.23 cm2
Height: 67 in
S' Lateral: 2.6 cm
Weight: 2926.4 oz

## 2023-03-14 LAB — TSH: TSH: 1.095 u[IU]/mL (ref 0.350–4.500)

## 2023-03-14 MED ORDER — ASPIRIN 81 MG PO TBEC
81.0000 mg | DELAYED_RELEASE_TABLET | Freq: Every day | ORAL | 11 refills | Status: DC
  Filled 2023-03-14: qty 30, 30d supply, fill #0

## 2023-03-14 MED ORDER — METOPROLOL SUCCINATE ER 25 MG PO TB24
12.5000 mg | ORAL_TABLET | Freq: Every day | ORAL | Status: DC
  Administered 2023-03-14: 12.5 mg via ORAL
  Filled 2023-03-14: qty 1

## 2023-03-14 MED ORDER — PRASUGREL HCL 10 MG PO TABS
10.0000 mg | ORAL_TABLET | Freq: Every day | ORAL | 11 refills | Status: DC
  Filled 2023-03-14: qty 30, 30d supply, fill #0

## 2023-03-14 MED ORDER — METOPROLOL SUCCINATE ER 25 MG PO TB24
12.5000 mg | ORAL_TABLET | Freq: Every day | ORAL | 5 refills | Status: DC
  Filled 2023-03-14: qty 15, 30d supply, fill #0

## 2023-03-14 MED ORDER — ATORVASTATIN CALCIUM 80 MG PO TABS
80.0000 mg | ORAL_TABLET | Freq: Every day | ORAL | 5 refills | Status: DC
  Filled 2023-03-14: qty 30, 30d supply, fill #0

## 2023-03-14 MED ORDER — ENOXAPARIN SODIUM 40 MG/0.4ML IJ SOSY
40.0000 mg | PREFILLED_SYRINGE | INTRAMUSCULAR | Status: DC
  Administered 2023-03-14: 40 mg via SUBCUTANEOUS
  Filled 2023-03-14: qty 0.4

## 2023-03-14 MED ORDER — NITROGLYCERIN 0.4 MG SL SUBL
0.4000 mg | SUBLINGUAL_TABLET | SUBLINGUAL | 3 refills | Status: DC | PRN
  Filled 2023-03-14: qty 25, 8d supply, fill #0

## 2023-03-14 NOTE — Progress Notes (Signed)
Echocardiogram 2D Echocardiogram has been performed.  Warren Lacy Carmie Lanpher RDCS 03/14/2023, 10:15 AM

## 2023-03-14 NOTE — Progress Notes (Addendum)
Per d/w Dr. Flora Lipps and Judie Petit. [place], tentatively sent meds to [facility] pharmacy in anticipaton of DC tomorrow. Dr Flora Lipps is OK with EC ASA at DC.

## 2023-03-14 NOTE — Progress Notes (Signed)
Cardiology Progress Note  Patient ID: Brent Carr MRN: [id number] DOB: Jul 28, 1966 Date of Encounter: 03/14/2023  Primary Cardiologist: None  Subjective   Chief Complaint: Chest pain overnight.  No further episodes  HPI: Denies any further chest pain episodes.  Telemetry unremarkable.  Cath site clean and dry.  ROS:  All other ROS reviewed and negative. Pertinent positives noted in the HPI.     Inpatient Medications  Scheduled Meds:  aspirin  81 mg Oral Daily   atorvastatin  80 mg Oral Daily   Chlorhexidine Gluconate Cloth  6 each Topical Daily   prasugrel  10 mg Oral Daily   sodium chloride flush  3 mL Intravenous Q12H   Continuous Infusions:  sodium chloride Stopped (03/13/23 1145)   sodium chloride     PRN Meds: sodium chloride, acetaminophen, morphine injection, ondansetron (ZOFRAN) IV, mouth rinse, oxyCODONE, sodium chloride flush   Vital Signs   Vitals:   03/14/23 0400 03/14/23 0500 03/14/23 0600 03/14/23 0700  BP: 115/83 115/83 126/84 116/75  Pulse: (!) 59 62 70 64  Resp: 12 16 12 13   Temp:      TempSrc:      SpO2: 98% 96% 98% 96%  Weight:      Height:        Intake/Output Summary (Last 24 hours) at 03/14/2023 0754 Last data filed at 03/14/2023 0700 Gross per 24 hour  Intake 1214.66 ml  Output 1200 ml  Net 14.66 ml      03/13/2023   11:35 AM 03/03/2013    9:15 AM  Last 3 Weights  Weight (lbs) 182 lb 14.4 oz 186 lb  Weight (kg) 82.963 kg 84.369 kg      Telemetry  Overnight telemetry shows sinus rhythm 60s, which I personally reviewed.   ECG  The most recent ECG shows sinus rhythm, no acute ischemic changes, which I personally reviewed.   Physical Exam   Vitals:   03/14/23 0400 03/14/23 0500 03/14/23 0600 03/14/23 0700  BP: 115/83 115/83 126/84 116/75  Pulse: (!) 59 62 70 64  Resp: 12 16 12 13   Temp:      TempSrc:      SpO2: 98% 96% 98% 96%  Weight:      Height:        Intake/Output Summary (Last 24 hours) at 03/14/2023  0754 Last data filed at 03/14/2023 0700 Gross per 24 hour  Intake 1214.66 ml  Output 1200 ml  Net 14.66 ml       03/13/2023   11:35 AM 03/03/2013    9:15 AM  Last 3 Weights  Weight (lbs) 182 lb 14.4 oz 186 lb  Weight (kg) 82.963 kg 84.369 kg    Body mass index is 28.65 kg/m.  General: Well nourished, well developed, in no acute distress Head: Atraumatic, normal size  Eyes: PEERLA, EOMI  Neck: Supple, no JVD Endocrine: No thryomegaly Cardiac: Normal S1, S2; RRR; no murmurs, rubs, or gallops Lungs: Clear to auscultation bilaterally, no wheezing, rhonchi or rales  Abd: Soft, nontender, no hepatomegaly  Ext: No edema, pulses 2+ Musculoskeletal: No deformities, BUE and BLE strength normal and equal Skin: Warm and dry, no rashes   Neuro: Alert and oriented to person, place, time, and situation, CNII-XII grossly intact, no focal deficits  Psych: Normal mood and affect   Labs  High Sensitivity Troponin:   Recent Labs  Lab 03/13/23 1137 03/13/23 1422  TROPONINIHS 306* 481*     Cardiac EnzymesNo results for input(s): "TROPONINI" in the  last 168 hours. No results for input(s): "TROPIPOC" in the last 168 hours.  Chemistry Recent Labs  Lab 03/13/23 1137 03/14/23 0115  NA 139 134*  K 3.8 3.9  CL 102 104  CO2 27 21*  GLUCOSE 148* 106*  BUN 14 12  CREATININE 1.13 1.06  CALCIUM 10.1 8.9  PROT 7.6  --   ALBUMIN 4.7  --   AST 27  --   ALT 23  --   ALKPHOS 67  --   BILITOT 0.7  --   GFRNONAA >60 >60  ANIONGAP 10 9    Hematology Recent Labs  Lab 03/13/23 1137 03/14/23 0115  WBC 10.2 15.3*  RBC 5.35 5.03  HGB 17.2* 15.6  HCT 47.9 45.7  MCV 89.5 90.9  MCH 32.1 31.0  MCHC 35.9 34.1  RDW 11.7 11.7  PLT 306 262   BNPNo results for input(s): "BNP", "PROBNP" in the last 168 hours.  DDimer No results for input(s): "DDIMER" in the last 168 hours.   Radiology  CARDIAC CATHETERIZATION  Result Date: 03/13/2023   Dist RCA lesion is 99% stenosed.   Mid Cx lesion is 50%  stenosed.   Mid LAD lesion is 30% stenosed.   A drug-eluting stent was successfully placed using a SYNERGY XD 2.75X38.   Post intervention, there is a 0% residual stenosis. Acute inferior ST elevation MI secondary to severe distal RCA stenosis. Successful PTCA/DES x 1 distal RCA Mild non-obstructive disease in the mid LAD Moderate non-obstructive disease in the mid Circumflex LVEDP 10 mmHg Recommendations: Admit to ICU with possible early discharge tomorrow. DAPT with ASA and Brilinta for 12 months. High intensity statin. Aggrastat infusion for two hours. No beta blocker unless his LV systolic function is down. Echo later today.    Cardiac Studies  LHC 03/13/2023   Dist RCA lesion is 99% stenosed.   Mid Cx lesion is 50% stenosed.   Mid LAD lesion is 30% stenosed.   A drug-eluting stent was successfully placed using a SYNERGY XD 2.75X38.   Post intervention, there is a 0% residual stenosis.   Acute inferior ST elevation MI secondary to severe distal RCA stenosis.  Successful PTCA/DES x 1 distal RCA Mild non-obstructive disease in the mid LAD Moderate non-obstructive disease in the mid Circumflex LVEDP 10 mmHg   Recommendations: Admit to ICU with possible early discharge tomorrow. DAPT with ASA and Brilinta for 12 months. High intensity statin. Aggrastat infusion for two hours. No beta blocker unless his LV systolic function is down. Echo later today.   Patient Profile  Brent Carr is a 58 y.o. male without medical history admitted on 03/13/2023 for inferior STEMI.  Assessment & Plan   # Inferior STEMI -Status post PCI to the distal RCA.  Doing well.  EKG stable.  Telemetry stable.  Transfer out of ICU today. -Add Toprol-XL 12.5 mg daily.  On aspirin and Effient.  Complete 1 year. -On Lipitor 80 mg daily. -Echo is pending.  Labs stable.  Not diabetic. -Anticipate discharge tomorrow as long as he does okay today.  FEN -No intravenous fluids -Code: Full -DVT PPx: Add  Lovenox -Diet: Heart healthy -Disposition: Transfer to floor today.  Likely discharge tomorrow.   For questions or updates, please contact [facility] Please consult [email or web address] for contact info under        Signed, Gerri Spore T. Flora Lipps, MD, [facility] [place]  [facility]  03/14/2023 7:54 AM

## 2023-03-14 NOTE — Progress Notes (Addendum)
Pt walked around the unit wth RN w/o CP.   Pt standing in room, received education on stent card, stent location, Antiplatelet and ASA use, wt restrictions, no baths/daily wash-ups, s/s of infection, ex guidelines, s/s to stop exercising, NTG use and calling 911, heart healthy diet, risk factors and CRPII. Pt received materials on exercise, diet, and CRPII. Will refer to [facility]   Referral placed to: [facility] under cardiologist Dr. Verne Carrow  Qualifying Dx: 6/28 STEMI; PCI   Brent Carr 03/14/2023 11:36 AM

## 2023-03-15 ENCOUNTER — Encounter (HOSPITAL_COMMUNITY): Payer: Self-pay | Admitting: Cardiovascular Disease

## 2023-03-15 DIAGNOSIS — I2119 ST elevation (STEMI) myocardial infarction involving other coronary artery of inferior wall: Secondary | ICD-10-CM | POA: Diagnosis not present

## 2023-03-15 NOTE — Discharge Instructions (Signed)
PLEASE REMEMBER TO BRING ALL OF YOUR MEDICATIONS TO EACH OF YOUR FOLLOW-UP OFFICE VISITS. ° °PLEASE ATTEND ALL SCHEDULED FOLLOW-UP APPOINTMENTS.  ° °Activity: Increase activity slowly as tolerated. You may shower, but no soaking baths (or swimming) for 1 week. No driving for 1 week. No lifting over 5 lbs for 2 weeks. No sexual activity for 1 week.  ° °You May Return to Work: in 3 weeks (if applicable) ° °Wound Care: You may wash cath site gently with soap and water. Keep cath site clean and dry. If you notice pain, swelling, bleeding or pus at your cath site, please call [phone] ° ° ° °Cardiac Cath Site Care °Refer to this sheet in the next few weeks. These instructions provide you with information on caring for yourself after your procedure. Your caregiver may also give you more specific instructions. Your treatment has been planned according to current medical practices, but problems sometimes occur. Call your caregiver if you have any problems or questions after your procedure. °HOME CARE INSTRUCTIONS °· You may shower 24 hours after the procedure. Remove the bandage (dressing) and gently wash the site with plain soap and water. Gently pat the site dry.  °· Do not apply powder or lotion to the site.  °· Do not sit in a bathtub, swimming pool, or whirlpool for 5 to 7 days.  °· No bending, squatting, or lifting anything over 10 pounds (4.5 kg) as directed by your caregiver.  °· Inspect the site at least twice daily.  °· Do not drive home if you are discharged the same day of the procedure. Have someone else drive you.  °· You may drive 24 hours after the procedure unless otherwise instructed by your caregiver.  °What to expect: °· Any bruising will usually fade within 1 to 2 weeks.  °· Blood that collects in the tissue (hematoma) may be painful to the touch. It should usually decrease in size and tenderness within 1 to 2 weeks.  °SEEK IMMEDIATE MEDICAL CARE IF: °· You have unusual pain at the site or down the  affected limb.  °· You have redness, warmth, swelling, or pain at the site.  °· You have drainage (other than a small amount of blood on the dressing).  °· You have chills.  °· You have a fever or persistent symptoms for more than 72 hours.  °· You have a fever and your symptoms suddenly get worse.  °· Your leg becomes pale, cool, tingly, or numb.  °· You have heavy bleeding from the site. Hold pressure on the site.  °Document Released: 10/04/2010 Document Revised: 08/21/2011 Document Reviewed:  ° °

## 2023-03-15 NOTE — Plan of Care (Signed)
  Problem: Education: Goal: Knowledge of General Education information will improve Description Including pain rating scale, medication(s)/side effects and non-pharmacologic comfort measures Outcome: Adequate for Discharge   Problem: Health Behavior/Discharge Planning: Goal: Ability to manage health-related needs will improve Outcome: Adequate for Discharge   

## 2023-03-15 NOTE — Discharge Summary (Addendum)
Discharge Summary    Patient ID: Brent Carr MRN: [id number]; DOB: May 06, 1966  Admit date: 03/13/2023 Discharge date: 03/15/2023  PCP:  Oneita Hurt No   [facility] Providers Cardiologist:  Verne Carrow, MD        Discharge Diagnoses    Principal Problem:   Acute ST elevation myocardial infarction (STEMI) of inferior wall (HCC)    Diagnostic Studies/Procedures    ECHO: 03/14/2023  1. Left ventricular ejection fraction, by estimation, is 55 to 60%. The left ventricle has normal function. The left ventricle has no regional wall motion abnormalities. Left ventricular diastolic parameters were normal.   2. Right ventricular systolic function is normal. The right ventricular size is normal. Tricuspid regurgitation signal is inadequate for assessing PA pressure.   3. The mitral valve is grossly normal. Trivial mitral valve regurgitation. No evidence of mitral stenosis.   4. The aortic valve is tricuspid. There is mild calcification of the aortic valve. Aortic valve regurgitation is not visualized. Aortic valve  sclerosis is present, with no evidence of aortic valve stenosis.   5. The inferior vena cava is normal in size with greater than 50% respiratory variability, suggesting right atrial pressure of 3 mmHg.   CARDIAC CATH: 03/13/2023   Dist RCA lesion is 99% stenosed.   Mid Cx lesion is 50% stenosed.   Mid LAD lesion is 30% stenosed.   A drug-eluting stent was successfully placed using a SYNERGY XD 2.75X38.   Post intervention, there is a 0% residual stenosis.   Acute inferior ST elevation MI secondary to severe distal RCA stenosis.  Successful PTCA/DES x 1 distal RCA Mild non-obstructive disease in the mid LAD Moderate non-obstructive disease in the mid Circumflex LVEDP 10 mmHg   Recommendations: Admit to ICU with possible early discharge tomorrow. DAPT with ASA and Brilinta for 12 months. High intensity statin. Aggrastat infusion for two hours. No beta  blocker unless his LV systolic function is down. Echo later today.     _____________   History of Present Illness     Brent Carr is a 57 y.o. male with no previous known medical history.   He began having chest pain on 6/25.  It became more severe on 6/27 and he came to the emergency room.  In the ER his ECG was consistent with a STEMI.  He initially went to [facility] but was transferred to [facility] for urgent cath.  Hospital Course     Consultants: None   Cardiac catheterization results are above.  He had DES to the RCA with nonobstructive disease in the LAD and CFX, medical therapy.  He had an Aggrastat infusion for 2 hours.  He is to be on aspirin and Brilinta for 12 months.  He was started on high intensity statin.  He was also started on a low-dose beta-blocker.  He ambulated with cardiac rehab and did well.  He was referred to outpatient cardiac rehab.  He was educated on stent restrictions, use of nitroglycerin and other heart healthy lifestyle modifications.  On 6/30 he was seen by Dr. Bufford Buttner and all data were reviewed.  His vital signs were stable, he was ambulating without chest pain or shortness of breath.  No further inpatient workup is indicated and he is considered stable for discharge, to follow up as an outpatient.     Did the patient have an acute coronary syndrome (MI, NSTEMI, STEMI, etc) this admission?:  Yes  AHA/ACC Clinical Performance & Quality Measures: Aspirin prescribed? - Yes ADP Receptor Inhibitor (Plavix/Clopidogrel, Brilinta/Ticagrelor or Effient/Prasugrel) prescribed (includes medically managed patients)? - Yes Beta Blocker prescribed? - Yes High Intensity Statin (Lipitor 40-80mg  or Crestor 20-40mg ) prescribed? - Yes EF assessed during THIS hospitalization? - Yes For EF <40%, was ACEI/ARB prescribed? - Not Applicable (EF >/= 40%) For EF <40%, Aldosterone Antagonist (Spironolactone or Eplerenone) prescribed?  - Not Applicable (EF >/= 40%) Cardiac Rehab Phase II ordered (including medically managed patients)? - Yes       The patient will be scheduled for a TOC follow up. A message has been sent to the [facility] and Scheduling Pool at the office where the patient should be seen for follow up.  _____________  Discharge Vitals Blood pressure 125/84, pulse 78, temperature 98.3 F (36.8 C), temperature source Oral, resp. rate 18, height 5\' 7"  (1.702 m), weight 83 kg, SpO2 98 %.  Filed Weights   03/13/23 1135  Weight: 83 kg  General: Well developed, well nourished, male in no acute distress Head: Eyes PERRLA, Head normocephalic and atraumatic Lungs: clear bilaterally to auscultation. Heart: HRRR S1 S2, without rub or gallop. No murmur. 4/4 extremity pulses are 2+ & equal. No JVD. R radial cath site w/ minimal ecchymosis, no edema, good pulse. Abdomen: Bowel sounds are present, abdomen soft and non-tender without masses or  hernias noted. Msk: Normal strength and tone for age. Extremities: No clubbing, cyanosis or edema.    Skin:  No rashes or lesions noted. Neuro: Alert and oriented X 3. Psych:  Good affect, responds appropriately   Labs & Radiologic Studies    CBC Recent Labs    03/13/23 1137 03/14/23 0115  WBC 10.2 15.3*  NEUTROABS 6.2  --   HGB 17.2* 15.6  HCT 47.9 45.7  MCV 89.5 90.9  PLT 306 262   Basic Metabolic Panel Recent Labs    16/10/96 1137 03/14/23 0115  NA 139 134*  K 3.8 3.9  CL 102 104  CO2 27 21*  GLUCOSE 148* 106*  BUN 14 12  CREATININE 1.13 1.06  CALCIUM 10.1 8.9   Liver Function Tests Recent Labs    03/13/23 1137  AST 27  ALT 23  ALKPHOS 67  BILITOT 0.7  PROT 7.6  ALBUMIN 4.7   No results for input(s): "LIPASE", "AMYLASE" in the last 72 hours. High Sensitivity Troponin:   Recent Labs  Lab 03/13/23 1137 03/13/23 1422  TROPONINIHS 306* 481*     Fasting Lipid Panel Recent Labs    03/13/23 1137  CHOL 225*  HDL 47  LDLCALC 153*  TRIG  125  CHOLHDL 4.8   Thyroid Function Tests Recent Labs    03/14/23 0115  TSH 1.095   _____________  ECHOCARDIOGRAM COMPLETE  Result Date: 03/14/2023    ECHOCARDIOGRAM REPORT   Patient Name:   Brent Carr Date of Exam: 03/14/2023 Medical Rec #:  [id number]             Height:       67.0 in Accession #:    [id number]            Weight:       182.9 lb Date of Birth:  21-Mar-1966             BSA:          1.947 m Patient Age:    57 years              BP:  127/88 mmHg Patient Gender: M                     HR:           75 bpm. Exam Location:  Inpatient Procedure: 2D Echo, Color Doppler and Cardiac Doppler Indications:    R07.9* Chest pain, unspecified  History:        Patient has no prior history of Echocardiogram examinations.  Sonographer:    Irving Burton Senior RDCS Referring Phys: [id number] D MCAL[place] IMPRESSIONS  1. Left ventricular ejection fraction, by estimation, is 55 to 60%. The left ventricle has normal function. The left ventricle has no regional wall motion abnormalities. Left ventricular diastolic parameters were normal.  2. Right ventricular systolic function is normal. The right ventricular size is normal. Tricuspid regurgitation signal is inadequate for assessing PA pressure.  3. The mitral valve is grossly normal. Trivial mitral valve regurgitation. No evidence of mitral stenosis.  4. The aortic valve is tricuspid. There is mild calcification of the aortic valve. Aortic valve regurgitation is not visualized. Aortic valve sclerosis is present, with no evidence of aortic valve stenosis.  5. The inferior vena cava is normal in size with greater than 50% respiratory variability, suggesting right atrial pressure of 3 mmHg. FINDINGS  Left Ventricle: Left ventricular ejection fraction, by estimation, is 55 to 60%. The left ventricle has normal function. The left ventricle has no regional wall motion abnormalities. The left ventricular internal cavity size was normal in size. There is   no left ventricular hypertrophy of the septal segment. Left ventricular diastolic parameters were normal. Right Ventricle: The right ventricular size is normal. No increase in right ventricular wall thickness. Right ventricular systolic function is normal. Tricuspid regurgitation signal is inadequate for assessing PA pressure. Left Atrium: Left atrial size was normal in size. Right Atrium: Right atrial size was normal in size. Pericardium: There is no evidence of pericardial effusion. Presence of epicardial fat layer. Mitral Valve: The mitral valve is grossly normal. Trivial mitral valve regurgitation. No evidence of mitral valve stenosis. Tricuspid Valve: The tricuspid valve is grossly normal. Tricuspid valve regurgitation is trivial. No evidence of tricuspid stenosis. Aortic Valve: The aortic valve is tricuspid. There is mild calcification of the aortic valve. Aortic valve regurgitation is not visualized. Aortic valve sclerosis is present, with no evidence of aortic valve stenosis. Pulmonic Valve: The pulmonic valve was grossly normal. Pulmonic valve regurgitation is not visualized. No evidence of pulmonic stenosis. Aorta: The aortic root and ascending aorta are structurally normal, with no evidence of dilitation. Venous: The inferior vena cava is normal in size with greater than 50% respiratory variability, suggesting right atrial pressure of 3 mmHg. IAS/Shunts: The atrial septum is grossly normal.  LEFT VENTRICLE PLAX 2D LVIDd:         3.60 cm   Diastology LVIDs:         2.60 cm   LV e' medial:    7.40 cm/s LV PW:         1.00 cm   LV E/e' medial:  10.9 LV IVS:        1.10 cm   LV e' lateral:   8.38 cm/s LVOT diam:     1.90 cm   LV E/e' lateral: 9.6 LV SV:         42 LV SV Index:   22 LVOT Area:     2.84 cm  RIGHT VENTRICLE RV S prime:     12.80 cm/s TAPSE (  M-mode): 1.7 cm LEFT ATRIUM             Index        RIGHT ATRIUM           Index LA diam:        2.80 cm 1.44 cm/m   RA Area:     10.90 cm LA Vol (A2C):    30.2 ml 15.51 ml/m  RA Volume:   21.10 ml  10.84 ml/m LA Vol (A4C):   34.2 ml 17.57 ml/m LA Biplane Vol: 32.3 ml 16.59 ml/m  AORTIC VALVE LVOT Vmax:   81.80 cm/s LVOT Vmean:  58.300 cm/s LVOT VTI:    0.149 m  AORTA Ao Root diam: 3.20 cm Ao Asc diam:  3.30 cm MITRAL VALVE MV Area (PHT): 3.23 cm    SHUNTS MV Decel Time: 235 msec    Systemic VTI:  0.15 m MV E velocity: 80.40 cm/s  Systemic Diam: 1.90 cm MV A velocity: 84.30 cm/s MV E/A ratio:  0.95 Lennie Odor MD Electronically signed by Lennie Odor MD Signature Date/Time: 03/14/2023/12:46:24 PM    Final    CARDIAC CATHETERIZATION  Result Date: 03/13/2023   Dist RCA lesion is 99% stenosed.   Mid Cx lesion is 50% stenosed.   Mid LAD lesion is 30% stenosed.   A drug-eluting stent was successfully placed using a SYNERGY XD 2.75X38.   Post intervention, there is a 0% residual stenosis. Acute inferior ST elevation MI secondary to severe distal RCA stenosis. Successful PTCA/DES x 1 distal RCA Mild non-obstructive disease in the mid LAD Moderate non-obstructive disease in the mid Circumflex LVEDP 10 mmHg Recommendations: Admit to ICU with possible early discharge tomorrow. DAPT with ASA and Brilinta for 12 months. High intensity statin. Aggrastat infusion for two hours. No beta blocker unless his LV systolic function is down. Echo later today.   Disposition   Pt is being discharged home today in good condition.  Follow-up Plans & Appointments     Follow-up Information     Pridgen, Taylar, NP Follow up.   Specialty: Family Medicine Why: Tueday, March 17, 2023 at 1000 am, please to reschedule with coordinator # [phone] information: [address] [place] [address] [phone]         Sande Rives, MD Follow up.   Specialties: Cardiology, Internal Medicine, Radiology Why: The office should call in the next 2 days. If you do not get a call, please call us. Contact information: [address] [place]  [address] [phone]                Discharge Instructions     Amb Referral to Cardiac Rehabilitation   Complete by: As directed    Diagnosis:  Coronary Stents STEMI     After initial evaluation and assessments completed: Virtual Based Care may be provided alone or in conjunction with Phase 2 Cardiac Rehab based on patient barriers.: Yes   Intensive Cardiac Rehabilitation (ICR) [facility] location only OR Traditional Cardiac Rehabilitation (TCR) *If criteria for ICR are not met will enroll in TCR [facility] only): Yes   Diet - low sodium heart healthy   Complete by: As directed    Increase activity slowly   Complete by: As directed         Discharge Medications   Allergies as of 03/15/2023   No Known Allergies      Medication List     STOP taking these medications  ibuprofen 200 MG tablet Commonly known as: ADVIL       TAKE these medications    acetaminophen 500 MG tablet Commonly known as: TYLENOL Take 500 mg by mouth every 6 (six) hours as needed for moderate pain.   aspirin EC 81 MG tablet Take 1 tablet (81 mg total) by mouth daily. Swallow whole.   atorvastatin 80 MG tablet Commonly known as: LIPITOR Take 1 tablet (80 mg total) by mouth daily.   metoprolol succinate 25 MG 24 hr tablet Commonly known as: TOPROL-XL Take 0.5 tablets (12.5 mg total) by mouth daily.   nitroGLYCERIN 0.4 MG SL tablet Commonly known as: Nitrostat Place 1 tablet (0.4 mg total) under the tongue every 5 (five) minutes as needed for chest pain (up to 3 doses).   prasugrel 10 MG Tabs tablet Commonly known as: EFFIENT Take 1 tablet (10 mg total) by mouth daily.           Outstanding Labs/Studies   None  Duration of Discharge Encounter   Greater than 30 minutes including physician time.  Signed, Theodore Demark, PA-C 03/15/2023, 8:23 AM

## 2023-03-16 ENCOUNTER — Encounter (HOSPITAL_COMMUNITY): Payer: Self-pay | Admitting: Cardiovascular Disease

## 2023-03-16 LAB — HEMOGLOBIN A1C
Hgb A1c MFr Bld: 5.3 % (ref 4.8–5.6)
Mean Plasma Glucose: 105 mg/dL

## 2023-03-17 ENCOUNTER — Telehealth (HOSPITAL_COMMUNITY): Payer: Self-pay

## 2023-03-17 DIAGNOSIS — K449 Diaphragmatic hernia without obstruction or gangrene: Secondary | ICD-10-CM | POA: Diagnosis not present

## 2023-03-17 DIAGNOSIS — Z6828 Body mass index (BMI) 28.0-28.9, adult: Secondary | ICD-10-CM | POA: Diagnosis not present

## 2023-03-17 DIAGNOSIS — I252 Old myocardial infarction: Secondary | ICD-10-CM | POA: Diagnosis not present

## 2023-03-17 DIAGNOSIS — Z7902 Long term (current) use of antithrombotics/antiplatelets: Secondary | ICD-10-CM | POA: Diagnosis not present

## 2023-03-17 DIAGNOSIS — E782 Mixed hyperlipidemia: Secondary | ICD-10-CM | POA: Diagnosis not present

## 2023-03-17 LAB — LIPOPROTEIN A (LPA): Lipoprotein (a): 306.7 nmol/L — ABNORMAL HIGH (ref <75.0)

## 2023-03-17 NOTE — Telephone Encounter (Signed)
Called patient to see if he was interested in participating in the Cardiac Rehab Program. Patient stated no. I did explain he has up to a year if anything changes.

## 2023-03-21 NOTE — Progress Notes (Unsigned)
Cardiology Clinic Note   Patient Name: Brent Carr Date of Encounter: 03/21/2023  Primary Care Provider:  Pcp, No Primary Cardiologist:  Verne Carrow, MD  Patient Profile    57 year old male with history of coronary artery disease, status post inferior MI of the distal RCA.  Underwent cardiac catheterization on 03/13/2023 with distal RCA 99% stenosis, mid circumflex 50% stenosed LAD 30% stenosed.  Status post DES with 0% residual stenosis.  He was started on DAPT with Effient and aspirin, low-dose beta-blocker, high intensity statin.  LP(a) during hospitalization was elevated at 306, total cholesterol 225, LDL 153.  Past Medical History    Past Medical History:  Diagnosis Date   Breast mass    left   STEMI (ST elevation myocardial infarction) (HCC) 03/12/2023   Past Surgical History:  Procedure Laterality Date   ANTERIOR CRUCIATE LIGAMENT REPAIR  1986   left   CORONARY/GRAFT ACUTE MI REVASCULARIZATION N/A 03/13/2023   Procedure: Coronary/Graft Acute MI Revascularization;  Surgeon: Kathleene Hazel, MD;  Location: [facility];  Service: Cardiovascular;  Laterality: N/A;   GANGLION CYST EXCISION  2011   right wrist   LEFT HEART CATH AND CORONARY ANGIOGRAPHY N/A 03/13/2023   Procedure: LEFT HEART CATH AND CORONARY ANGIOGRAPHY;  Surgeon: Kathleene Hazel, MD;  Location: [facility];  Service: Cardiovascular;  Laterality: N/A;    Allergies  No Known Allergies  History of Present Illness    ***  Home Medications    Current Outpatient Medications  Medication Sig Dispense Refill   acetaminophen (TYLENOL) 500 MG tablet Take 500 mg by mouth every 6 (six) hours as needed for moderate pain.     aspirin EC 81 MG tablet Take 1 tablet (81 mg total) by mouth daily. Swallow whole. 30 tablet 11   atorvastatin (LIPITOR) 80 MG tablet Take 1 tablet (80 mg total) by mouth daily. 30 tablet 5   metoprolol succinate (TOPROL-XL) 25 MG 24 hr tablet Take  0.5 tablets (12.5 mg total) by mouth daily. 30 tablet 5   nitroGLYCERIN (NITROSTAT) 0.4 MG SL tablet Place 1 tablet (0.4 mg total) under the tongue every 5 (five) minutes as needed for chest pain (up to 3 doses). 25 tablet 3   prasugrel (EFFIENT) 10 MG TABS tablet Take 1 tablet (10 mg total) by mouth daily. 30 tablet 11   No current facility-administered medications for this visit.     Family History    Family History  Problem Relation Age of Onset   Cancer Mother        ovarian   Heart disease Father    Heart disease Brother    He indicated that his mother is alive. He indicated that his father is alive. He indicated that the status of his brother is unknown.  Social History    Social History   Socioeconomic History   Marital status: Married    Spouse name: Not on file   Number of children: Not on file   Years of education: Not on file   Highest education level: Not on file  Occupational History   Not on file  Tobacco Use   Smoking status: Never   Smokeless tobacco: Not on file  Substance and Sexual Activity   Alcohol use: No   Drug use: No   Sexual activity: Not on file  Other Topics Concern   Not on file  Social History Narrative   Not on file   Social Determinants of Health  Financial Resource Strain: Not on file  Food Insecurity: No Food Insecurity (03/13/2023)   Hunger Vital Sign    Worried About Running Out of Food in the Last Year: Never true    Ran Out of Food in the Last Year: Never true  Transportation Needs: No Transportation Needs (03/13/2023)   PRAPARE - Administrator, Civil Service (Medical): No    Lack of Transportation (Non-Medical): No  Physical Activity: Not on file  Stress: Not on file  Social Connections: Not on file  Intimate Partner Violence: Not At Risk (03/13/2023)   Humiliation, Afraid, Rape, and Kick questionnaire    Fear of Current or Ex-Partner: No    Emotionally Abused: No    Physically Abused: No    Sexually Abused:  No     Review of Systems    General:  No chills, fever, night sweats or weight changes.  Cardiovascular:  No chest pain, dyspnea on exertion, edema, orthopnea, palpitations, paroxysmal nocturnal dyspnea. Dermatological: No rash, lesions/masses Respiratory: No cough, dyspnea Urologic: No hematuria, dysuria Abdominal:   No nausea, vomiting, diarrhea, bright red blood per rectum, melena, or hematemesis Neurologic:  No visual changes, wkns, changes in mental status. All other systems reviewed and are otherwise negative except as noted above.       Physical Exam    VS:  There were no vitals taken for this visit. , BMI There is no height or weight on file to calculate BMI.     GEN: Well nourished, well developed, in no acute distress. HEENT: normal. Neck: Supple, no JVD, carotid bruits, or masses. Cardiac: RRR, no murmurs, rubs, or gallops. No clubbing, cyanosis, edema.  Radials/DP/PT 2+ and equal bilaterally.  Respiratory:  Respirations regular and unlabored, clear to auscultation bilaterally. GI: Soft, nontender, nondistended, BS + x 4. MS: no deformity or atrophy. Skin: warm and dry, no rash. Neuro:  Strength and sensation are intact. Psych: Normal affect.      Lab Results  Component Value Date   WBC 15.3 (H) 03/14/2023   HGB 15.6 03/14/2023   HCT 45.7 03/14/2023   MCV 90.9 03/14/2023   PLT 262 03/14/2023   Lab Results  Component Value Date   CREATININE 1.06 03/14/2023   BUN 12 03/14/2023   NA 134 (L) 03/14/2023   K 3.9 03/14/2023   CL 104 03/14/2023   CO2 21 (L) 03/14/2023   Lab Results  Component Value Date   ALT 23 03/13/2023   AST 27 03/13/2023   ALKPHOS 67 03/13/2023   BILITOT 0.7 03/13/2023   Lab Results  Component Value Date   CHOL 225 (H) 03/13/2023   HDL 47 03/13/2023   LDLCALC 153 (H) 03/13/2023   TRIG 125 03/13/2023   CHOLHDL 4.8 03/13/2023    Lab Results  Component Value Date   HGBA1C 5.3 03/14/2023                   Diagnostic  Studies/Procedures    ECHO: 03/14/2023  1. Left ventricular ejection fraction, by estimation, is 55 to 60%. The left ventricle has normal function. The left ventricle has no regional wall motion abnormalities. Left ventricular diastolic parameters were normal.   2. Right ventricular systolic function is normal. The right ventricular size is normal. Tricuspid regurgitation signal is inadequate for assessing PA pressure.   3. The mitral valve is grossly normal. Trivial mitral valve regurgitation. No evidence of mitral stenosis.   4. The aortic valve is tricuspid. There is mild calcification  of the aortic valve. Aortic valve regurgitation is not visualized. Aortic valve  sclerosis is present, with no evidence of aortic valve stenosis.   5. The inferior vena cava is normal in size with greater than 50% respiratory variability, suggesting right atrial pressure of 3 mmHg.    CARDIAC CATH: 03/13/2023   Dist RCA lesion is 99% stenosed.   Mid Cx lesion is 50% stenosed.   Mid LAD lesion is 30% stenosed.   A drug-eluting stent was successfully placed using a SYNERGY XD [id number].   Post intervention, there is a 0% residual stenosis.   Acute inferior ST elevation MI secondary to severe distal RCA stenosis.  Successful PTCA/DES x 1 distal RCA Mild non-obstructive disease in the mid LAD Moderate non-obstructive disease in the mid Circumflex LVEDP 10 mmHg        Assessment & Plan   1.  ***     {Are you ordering a CV Procedure (e.g. stress test, cath, DCCV, TEE, etc)?   Press F2        :[id number]}   Signed, Bettey Mare. Liborio Nixon, ANP, AACC   03/21/2023 7:28 PM      Office [phone] Fax [phone]  Notice: This dictation was prepared with Dragon dictation along with smaller phrase technology. Any transcriptional errors that result from this process are unintentional and may not be corrected upon review.

## 2023-03-26 ENCOUNTER — Encounter: Payer: Self-pay | Admitting: Adult Health

## 2023-03-26 ENCOUNTER — Ambulatory Visit: Payer: 59 | Attending: Adult Health | Admitting: Adult Health

## 2023-03-26 VITALS — BP 130/78 | HR 68 | Ht 67.0 in | Wt 181.4 lb

## 2023-03-26 DIAGNOSIS — I2119 ST elevation (STEMI) myocardial infarction involving other coronary artery of inferior wall: Secondary | ICD-10-CM

## 2023-03-26 NOTE — Patient Instructions (Signed)
Medication Instructions:  No Changes *If you need a refill on your cardiac medications before your next appointment, please call your pharmacy*   Lab Work: CMET, CBC, Lipid Panel : 1 week prior to Follow Appointment If you have labs (blood work) drawn today and your tests are completely normal, you will receive your results only by: MyChart Message (if you have MyChart) OR A paper copy in the mail If you have any lab test that is abnormal or we need to change your treatment, we will call you to review the results.   Testing/Procedures: No Testing   Follow-Up: At [facility], you and your health needs are our priority.  As part of our continuing mission to provide you with exceptional heart care, we have created designated Provider Care Teams.  These Care Teams include your primary Cardiologist (physician) and Advanced Practice Providers (APPs -  Physician Assistants and Nurse Practitioners) who all work together to provide you with the care you need, when you need it.  We recommend signing up for the patient portal called "MyChart".  Sign up information is provided on this After Visit Summary.  MyChart is used to connect with patients for Virtual Visits (Telemedicine).  Patients are able to view lab/test results, encounter notes, upcoming appointments, etc.  Non-urgent messages can be sent to your provider as well.   To learn more about what you can do with MyChart, go to [email or web address].    Your next appointment:   3 month(s)  Provider:   Gerri Spore T. Flora Lipps, MD

## 2023-04-07 ENCOUNTER — Telehealth: Payer: Self-pay | Admitting: Cardiovascular Disease

## 2023-04-07 MED ORDER — ATORVASTATIN CALCIUM 80 MG PO TABS: 80.0000 mg | ORAL_TABLET | Freq: Every day | ORAL | 3 refills | Status: DC

## 2023-04-07 MED ORDER — METOPROLOL SUCCINATE ER 25 MG PO TB24: 12.5000 mg | ORAL_TABLET | Freq: Every day | ORAL | 3 refills | Status: DC

## 2023-04-07 MED ORDER — PRASUGREL HCL 10 MG PO TABS: 10.0000 mg | ORAL_TABLET | Freq: Every day | ORAL | 3 refills | Status: DC

## 2023-04-07 NOTE — Telephone Encounter (Signed)
*  STAT* If patient is at the pharmacy, call can be transferred to refill team.   1. Which medications need to be refilled? (please list name of each medication and dose if known)   prasugrel (EFFIENT) 10 MG TABS tablet Take 1 tablet (10 mg total) by mouth daily.    atorvastatin (LIPITOR) 80 MG tablet Take 1 tablet (80 mg total) by mouth daily.   metoprolol succinate (TOPROL-XL) 25 MG 24 hr tablet Take 0.5 tablets (12.5 mg total) by mouth daily.       4. Which pharmacy/location (including street and city if local pharmacy) is medication to be sent to?  [id number] - [place], [place] - [address] Phone: [phone]  Fax: [phone]     5. Do they need a 30 day or 90 day supply? 90

## 2023-04-07 NOTE — Telephone Encounter (Signed)
Pt's medications were sent to pt's pharmacy as requested. Confirmation received.  

## 2023-04-10 MED ORDER — ASPIRIN 81 MG PO TBEC: 81.0000 mg | DELAYED_RELEASE_TABLET | Freq: Every day | ORAL | 11 refills | Status: DC

## 2023-04-10 MED ORDER — NITROGLYCERIN 0.4 MG SL SUBL: 0.4000 mg | SUBLINGUAL_TABLET | SUBLINGUAL | 3 refills | Status: DC | PRN

## 2023-04-29 DIAGNOSIS — R58 Hemorrhage, not elsewhere classified: Secondary | ICD-10-CM | POA: Diagnosis not present

## 2023-04-29 DIAGNOSIS — Z6828 Body mass index (BMI) 28.0-28.9, adult: Secondary | ICD-10-CM | POA: Diagnosis not present

## 2023-07-30 DIAGNOSIS — I2119 ST elevation (STEMI) myocardial infarction involving other coronary artery of inferior wall: Secondary | ICD-10-CM | POA: Diagnosis not present

## 2023-07-31 LAB — COMPREHENSIVE METABOLIC PANEL
ALT: 33 [IU]/L (ref 0–44)
AST: 31 [IU]/L (ref 0–40)
Albumin: 4.6 g/dL (ref 3.8–4.9)
Alkaline Phosphatase: 98 [IU]/L (ref 44–121)
BUN/Creatinine Ratio: 13 (ref 9–20)
BUN: 13 mg/dL (ref 6–24)
Bilirubin Total: 0.6 mg/dL (ref 0.0–1.2)
CO2: 25 mmol/L (ref 20–29)
Calcium: 9.7 mg/dL (ref 8.7–10.2)
Chloride: 103 mmol/L (ref 96–106)
Creatinine, Ser: 1.03 mg/dL (ref 0.76–1.27)
Globulin, Total: 2 g/dL (ref 1.5–4.5)
Glucose: 85 mg/dL (ref 70–99)
Potassium: 4.7 mmol/L (ref 3.5–5.2)
Sodium: 141 mmol/L (ref 134–144)
Total Protein: 6.6 g/dL (ref 6.0–8.5)
eGFR: 85 mL/min/{1.73_m2} (ref >59)

## 2023-07-31 LAB — LIPID PANEL
Chol/HDL Ratio: 4.2 {ratio} (ref 0.0–5.0)
Cholesterol, Total: 160 mg/dL (ref 100–199)
HDL: 38 mg/dL — ABNORMAL LOW (ref >39)
LDL Chol Calc (NIH): 105 mg/dL — ABNORMAL HIGH (ref 0–99)
Triglycerides: 88 mg/dL (ref 0–149)
VLDL Cholesterol Cal: 17 mg/dL (ref 5–40)

## 2023-07-31 LAB — CBC
Hematocrit: 48.4 % (ref 37.5–51.0)
Hemoglobin: 16.4 g/dL (ref 13.0–17.7)
MCH: 31.7 pg (ref 26.6–33.0)
MCHC: 33.9 g/dL (ref 31.5–35.7)
MCV: 94 fL (ref 79–97)
Platelets: 262 10*3/uL (ref 150–450)
RBC: 5.17 x10E6/uL (ref 4.14–5.80)
RDW: 11.6 % (ref 11.6–15.4)
WBC: 8.2 10*3/uL (ref 3.4–10.8)

## 2023-08-03 ENCOUNTER — Telehealth: Payer: Self-pay

## 2023-08-03 DIAGNOSIS — E785 Hyperlipidemia, unspecified: Secondary | ICD-10-CM

## 2023-08-03 MED ORDER — EZETIMIBE 10 MG PO TABS: 10.0000 mg | ORAL_TABLET | Freq: Every day | ORAL | 3 refills | Status: DC

## 2023-08-03 NOTE — Telephone Encounter (Addendum)
Called patient regarding results. Patient had understanding of results. Patient advised to start Zetia 10 mg  1 tablet daily and to repeat Lipid panel in 6 weeks . Patient had understanding of results. ----- Message from Joni Reining sent at 07/31/2023  7:44 AM EST ----- Labs have been reviewed.  LDL is not at goal. Please add Zetia 10 mg daily. Will need to repeat lipid studies in 6 weeks.

## 2023-08-04 NOTE — Progress Notes (Unsigned)
Cardiology Office Note:  .   Date:  08/05/2023  ID:  Brent Carr, DOB 08-Oct-1965, MRN [id number] PCP: Inez Pilgrim, NP  [facility] Providers Cardiologist:  Verne Carrow, MD { History of Present Illness: .   Brent Carr is a 57 y.o. male with history of CAD, HLD who presents for follow-up.    History of Present Illness   Brent Carr, a 57 year old individual with a history of inferior STEMI, elevated LPA, and hyperlipidemia, presents for a follow-up visit. He reports feeling well since his heart attack, with no complaints of chest pain or breathing difficulties. He has been adhering to his prescribed medication regimen, which includes aspirin, Effient, Lipitor 80, and recently added Zetia 10. He was not on any cholesterol medication prior to the heart attack. He has a family history of heart disease, with his father and brother having experienced heart troubles. He does not smoke or consume alcohol and maintains an active lifestyle. 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 He has two children. He also reports a long-ago surgery for an ACL injury.          Problem List CAD -Inferior STEMI PCI RCA 03/13/2023 -50% LCX -30% LAD -Lpa 306 2. HLD -T chol 160, HDL 38, LDL 105, TG 38    ROS: All other ROS reviewed and negative. Pertinent positives noted in the HPI.     Studies Reviewed: Marland Kitchen        TTE 03/14/2023  1. Left ventricular ejection fraction, by estimation, is 55 to 60%. The  left ventricle has normal function. The left ventricle has no regional  wall motion abnormalities. Left ventricular diastolic parameters were  normal.   2. Right ventricular systolic function is normal. The right ventricular  size is normal. Tricuspid regurgitation signal is inadequate for assessing  PA pressure.   3. The mitral valve is grossly normal. Trivial mitral valve  regurgitation. No evidence of mitral stenosis.   4. The aortic valve  is tricuspid. There is mild calcification of the  aortic valve. Aortic valve regurgitation is not visualized. Aortic valve  sclerosis is present, with no evidence of aortic valve stenosis.   5. The inferior vena cava is normal in size with greater than 50%  respiratory variability, suggesting right atrial pressure of 3 mmHg.  Physical Exam:   VS:  BP 130/84 (BP Location: Right Arm, Patient Position: Sitting, Cuff Size: Normal)   Pulse 70   Ht 5\' 7"  (1.702 m)   Wt 183 lb 9.6 oz (83.3 kg)   SpO2 98%   BMI 28.76 kg/m    Wt Readings from Last 3 Encounters:  08/05/23 183 lb 9.6 oz (83.3 kg)  03/26/23 181 lb 6.4 oz (82.3 kg)  03/13/23 182 lb 14.4 oz (83 kg)    GEN: Well nourished, well developed in no acute distress NECK: No JVD; No carotid bruits CARDIAC: RRR, no murmurs, rubs, gallops RESPIRATORY:  Clear to auscultation without rales, wheezing or rhonchi  ABDOMEN: Soft, non-tender, non-distended EXTREMITIES:  No edema; No deformity  ASSESSMENT AND PLAN: .   Assessment and Plan    Inferior STEMI No current chest pain or dyspnea. No residual blockages requiring stenting. Normal heart function. -Continue Aspirin and Effient until February 19, 2024. -on BB  Hyperlipidemia with elevated LPA Despite Lipitor 80mg  and Zetia 10mg , cholesterol levels remain high. Elevated LPA is a genetic marker for heart disease. -Refer to pharmacy lipid clinic for PCSK9 inhibitor therapy. -Continue Lipitor  80mg . -Once on PCSK9 inhibitor, consider discontinuing Zetia.  Follow-up in 6 months, around the anniversary of the heart attack.              Follow-up: Return in about 6 months (around 02/02/2024).  Time Spent with Patient: I have spent a total of 35 minutes caring for this patient today face to face, ordering and reviewing labs/tests, reviewing prior records/medical history, examining the patient, establishing an assessment and plan, communicating results/findings to the patient/family, and  documenting in the medical record.   Signed, Brent Gilford. Flora Lipps, MD, [facility]  [facility]  [address] [place], [place] [address] [phone]  4:11 PM

## 2023-08-05 ENCOUNTER — Encounter: Payer: Self-pay | Admitting: Cardiovascular Disease

## 2023-08-05 ENCOUNTER — Ambulatory Visit: Payer: 59 | Attending: Cardiovascular Disease | Admitting: Cardiovascular Disease

## 2023-08-05 ENCOUNTER — Ambulatory Visit: Payer: 59 | Admitting: Cardiovascular Disease

## 2023-08-05 VITALS — BP 130/84 | HR 70 | Ht 67.0 in | Wt 183.6 lb

## 2023-08-05 DIAGNOSIS — E782 Mixed hyperlipidemia: Secondary | ICD-10-CM | POA: Diagnosis not present

## 2023-08-05 DIAGNOSIS — I2510 Atherosclerotic heart disease of native coronary artery without angina pectoris: Secondary | ICD-10-CM | POA: Diagnosis not present

## 2023-08-05 NOTE — Patient Instructions (Addendum)
Medication Instructions:  - NO CHANGES    *If you need a refill on your cardiac medications before your next appointment, please call your pharmacy*   Lab Work: NONE    If you have labs (blood work) drawn today and your tests are completely normal, you will receive your results only by: MyChart Message (if you have MyChart) OR A paper copy in the mail If you have any lab test that is abnormal or we need to change your treatment, we will call you to review the results.   Testing/Procedures: NONE    Follow-Up: At [facility], you and your health needs are our priority.  As part of our continuing mission to provide you with exceptional heart care, we have created designated Provider Care Teams.  These Care Teams include your primary Cardiologist (physician) and Advanced Practice Providers (APPs -  Physician Assistants and Nurse Practitioners) who all work together to provide you with the care you need, when you need it.  We recommend signing up for the patient portal called "MyChart".  Sign up information is provided on this After Visit Summary.  MyChart is used to connect with patients for Virtual Visits (Telemedicine).  Patients are able to view lab/test results, encounter notes, upcoming appointments, etc.  Non-urgent messages can be sent to your provider as well.   To learn more about what you can do with MyChart, go to [email or web address].    Your next appointment:   6 month(s)  The format for your next appointment:   In Person  Provider:   Dr. Lennie Odor, MD   Other Instructions  Dr. Flora Lipps has referred you to LIPID CLINIC to discuss treatment of your cholesterol.

## 2023-09-14 ENCOUNTER — Encounter: Payer: Self-pay | Admitting: Pharmacist

## 2023-09-14 ENCOUNTER — Ambulatory Visit: Payer: 59 | Attending: Internal Medicine | Admitting: Pharmacist

## 2023-09-14 DIAGNOSIS — I2119 ST elevation (STEMI) myocardial infarction involving other coronary artery of inferior wall: Secondary | ICD-10-CM

## 2023-09-14 DIAGNOSIS — M65971 Unspecified synovitis and tenosynovitis, right ankle and foot: Secondary | ICD-10-CM | POA: Diagnosis not present

## 2023-09-14 DIAGNOSIS — E782 Mixed hyperlipidemia: Secondary | ICD-10-CM | POA: Diagnosis not present

## 2023-09-14 DIAGNOSIS — I252 Old myocardial infarction: Secondary | ICD-10-CM

## 2023-09-14 DIAGNOSIS — L723 Sebaceous cyst: Secondary | ICD-10-CM | POA: Insufficient documentation

## 2023-09-14 DIAGNOSIS — I2510 Atherosclerotic heart disease of native coronary artery without angina pectoris: Secondary | ICD-10-CM

## 2023-09-14 DIAGNOSIS — L821 Other seborrheic keratosis: Secondary | ICD-10-CM | POA: Insufficient documentation

## 2023-09-14 NOTE — Progress Notes (Signed)
Patient ID: KOHEI BOORTZ                 DOB: May 20, 1966                    MRN: [id number]     HPI: Brent Carr is a 57 y.o. male patient referred to lipid clinic by Dr Flora Lipps. [facility] is significant for STEMI, elevated LPA, HLD, CAD, and a family history of CAD.  Patient presents today with wife. Since STEMI they have stopped eating as much fried foods. Have been trying to eat more lean proteins and whole grains.   Currently managed on atorvastatin 80mg  and ezetimibe 10mg  daily. No patient reported adverse effects.  Reports a family history of CAD  Current Medications:  Atorvastatin 80mg  daily Zetia 10mg  daily  Risk Factors:  CAD STEMI Family history Elevated LPA  LDL goal: <55  Labs: TC 160, Trigs 88, HDL 38, LDL 105 (07/30/23) LPA 306.7 (03/14/23)  Past Medical History:  Diagnosis Date   Breast mass    left   Coronary artery disease    STEMI (ST elevation myocardial infarction) (HCC) 03/12/2023    Current Outpatient Medications on File Prior to Visit  Medication Sig Dispense Refill   acetaminophen (TYLENOL) 500 MG tablet Take 500 mg by mouth every 6 (six) hours as needed for moderate pain.     aspirin EC 81 MG tablet Take 1 tablet (81 mg total) by mouth daily. Swallow whole. 30 tablet 11   atorvastatin (LIPITOR) 80 MG tablet Take 1 tablet (80 mg total) by mouth daily. 90 tablet 3   ezetimibe (ZETIA) 10 MG tablet Take 1 tablet (10 mg total) by mouth daily. 90 tablet 3   metoprolol succinate (TOPROL-XL) 25 MG 24 hr tablet Take 0.5 tablets (12.5 mg total) by mouth daily. 45 tablet 3   nitroGLYCERIN (NITROSTAT) 0.4 MG SL tablet Place 1 tablet (0.4 mg total) under the tongue every 5 (five) minutes as needed for chest pain (up to 3 doses). 25 tablet 3   prasugrel (EFFIENT) 10 MG TABS tablet Take 1 tablet (10 mg total) by mouth daily. 90 tablet 3   No current facility-administered medications on file prior to visit.    No Known  Allergies  Assessment/Plan:  1. Hyperlipidemia - Patient last LDL 105 which is above goal of <55. Since patient on high intensity statin with elevated LPA, recommend starting PCSK9i.  Using demo pen, educated patient on mechanism of action, storage, site selection, administration, and possible adverse effects. Patient able to demonstrate in room. Will complete PA and contact patient when approved. Recheck lipid panel in 2-3 months.  Continue atorvastatin 80mg  daily Continue Zetia 10mg  daily Start Repatha 140mg  q 2 weeks Recheck lipid panel in 2-3 months  Laural Golden, PharmD, BCACP, CDCES, CPP [address] [place], [place], [address] Phone: [phone], Fax: [phone]

## 2023-09-14 NOTE — Patient Instructions (Addendum)
It was nice meeting you two today  We would like your LDL less than 55  Please continue your atorvastatin 80mg  and ezetimibe 10mg  at this time  The medication we discussed today is called Repatha, which is an injection you would take once every 2 weeks  I will complete the prior authorization for you and message you when it is approved  Once you start the medication we will recheck your fasting lipid panel in about 2-3 months  Please let me know if you have any questions  Laural Golden, PharmD, BCACP, CDCES, CPP [address], [address] [place], [place], [address] Phone: [phone], Fax: [phone]

## 2023-09-22 ENCOUNTER — Telehealth: Payer: Self-pay | Admitting: Pharmacist

## 2023-09-22 ENCOUNTER — Encounter: Payer: Self-pay | Admitting: Pharmacist

## 2023-09-22 NOTE — Telephone Encounter (Signed)
 Please complete PA for Repatha

## 2023-09-23 ENCOUNTER — Telehealth: Payer: Self-pay | Admitting: Pharmacy Technician

## 2023-09-23 ENCOUNTER — Other Ambulatory Visit (HOSPITAL_COMMUNITY): Payer: Self-pay

## 2023-09-23 DIAGNOSIS — E782 Mixed hyperlipidemia: Secondary | ICD-10-CM

## 2023-09-23 DIAGNOSIS — I2510 Atherosclerotic heart disease of native coronary artery without angina pectoris: Secondary | ICD-10-CM

## 2023-09-23 DIAGNOSIS — I2119 ST elevation (STEMI) myocardial infarction involving other coronary artery of inferior wall: Secondary | ICD-10-CM

## 2023-09-23 MED ORDER — REPATHA SURECLICK 140 MG/ML ~~LOC~~ SOAJ
1.0000 mL | SUBCUTANEOUS | 1 refills | Status: DC
Start: 2023-09-23 — End: 2024-03-09

## 2023-09-23 NOTE — Addendum Note (Signed)
 Addended by: Cheree Ditto on: 09/23/2023 05:09 PM   Modules accepted: Orders

## 2023-09-23 NOTE — Telephone Encounter (Signed)
 Pharmacy Patient Advocate Encounter  Received notification from [employer] that Prior Authorization for repatha  has been APPROVED from 09/23/23 to 09/21/24. Ran test claim, Copay is $24.99 one month. This test claim was processed through [facility]- copay amounts may vary at other pharmacies due to pharmacy/plan contracts, or as the patient moves through the different stages of their insurance plan.   PA #/Case ID/Reference #: [id number]

## 2023-09-23 NOTE — Telephone Encounter (Signed)
 Pharmacy Patient Advocate Encounter   Received notification from Pt Calls Messages that prior authorization for repatha  is required/requested.   Insurance verification completed.   The patient is insured through [employer] .   Per test claim: PA required; PA submitted to above mentioned insurance via CoverMyMeds Key/confirmation #/EOC [id number] Status is pending

## 2023-09-24 ENCOUNTER — Other Ambulatory Visit (HOSPITAL_COMMUNITY): Payer: Self-pay

## 2023-12-10 ENCOUNTER — Encounter: Payer: Self-pay | Admitting: Cardiovascular Disease

## 2023-12-25 ENCOUNTER — Other Ambulatory Visit: Payer: Self-pay

## 2023-12-25 DIAGNOSIS — I2510 Atherosclerotic heart disease of native coronary artery without angina pectoris: Secondary | ICD-10-CM | POA: Diagnosis not present

## 2023-12-25 DIAGNOSIS — E782 Mixed hyperlipidemia: Secondary | ICD-10-CM

## 2023-12-25 DIAGNOSIS — I2119 ST elevation (STEMI) myocardial infarction involving other coronary artery of inferior wall: Secondary | ICD-10-CM

## 2023-12-25 LAB — LIPID PANEL
Chol/HDL Ratio: 2 ratio (ref 0.0–5.0)
Cholesterol, Total: 78 mg/dL — ABNORMAL LOW (ref 100–199)
HDL: 40 mg/dL (ref >39)
LDL Chol Calc (NIH): 23 mg/dL (ref 0–99)
Triglycerides: 63 mg/dL (ref 0–149)
VLDL Cholesterol Cal: 15 mg/dL (ref 5–40)

## 2023-12-28 ENCOUNTER — Telehealth (HOSPITAL_BASED_OUTPATIENT_CLINIC_OR_DEPARTMENT_OTHER): Payer: Self-pay

## 2023-12-28 NOTE — Telephone Encounter (Addendum)
 Results viewed by patient via My Chart----- Message from Jude Norton sent at 12/27/2023  8:00 AM EDT ----- Covering Kathryn's inbox.  Recent labs show cholesterol is at goal.  Continue current medications and follow-up as planned.  Thank you-EM

## 2024-03-09 ENCOUNTER — Other Ambulatory Visit: Payer: Self-pay | Admitting: Cardiovascular Disease

## 2024-03-09 DIAGNOSIS — E782 Mixed hyperlipidemia: Secondary | ICD-10-CM

## 2024-03-09 DIAGNOSIS — I2119 ST elevation (STEMI) myocardial infarction involving other coronary artery of inferior wall: Secondary | ICD-10-CM

## 2024-03-09 DIAGNOSIS — I2510 Atherosclerotic heart disease of native coronary artery without angina pectoris: Secondary | ICD-10-CM

## 2024-03-31 ENCOUNTER — Other Ambulatory Visit: Payer: Self-pay | Admitting: Adult Health

## 2024-04-03 ENCOUNTER — Other Ambulatory Visit: Payer: Self-pay | Admitting: Adult Health

## 2024-04-04 ENCOUNTER — Other Ambulatory Visit: Payer: Self-pay | Admitting: Adult Health

## 2024-04-04 ENCOUNTER — Other Ambulatory Visit: Payer: Self-pay | Admitting: Cardiovascular Disease

## 2024-04-06 ENCOUNTER — Encounter: Payer: Self-pay | Admitting: Cardiovascular Disease

## 2024-04-12 NOTE — Telephone Encounter (Signed)
Aspirin refilled.

## 2024-04-24 NOTE — Progress Notes (Unsigned)
 Cardiology Office Note:  .   Date:  04/25/2024  ID:  Brent Carr Servant, DOB [birth year], MRN [id number] PCP: Domenick Loma, NP  [facility] Providers Cardiologist:  Brent Cash, MD { History of Present Illness: .    Chief Complaint  Patient presents with   Follow-up   Coronary artery disease involving native coronary artery of    Brent Carr is a 58 y.o. male with history of CAD, HLD who presents for follow-up.    History of Present Illness   Brent Carr is a 58 year old male with a history of inferior STEMI who presents for follow-up on Repatha .  He has a history of inferior STEMI from June 2024 and is currently on Repatha , which has resulted in an LDL level of 23. He discontinued Prasugrel  in June and is now on aspirin  indefinitely. No chest pain or trouble breathing is reported, and he remains relatively active.  He experiences occasional swelling in his legs, particularly around the sock line, which improves by morning. He stands frequently during the day and attempts to elevate his feet when possible.  His current medications include aspirin , Lipitor, Zetia , Repatha , and metoprolol . He reports feeling fine with no new medical issues since the last visit.  He works in [employer] and is self-employed, which keeps him busy and involves some physical activity. He tries to incorporate walking and trail activities into his routine, although this has been inconsistent due to weather and other factors.          Problem List CAD -Inferior STEMI PCI RCA 03/13/2023 -50% LCX -30% LAD -Lpa 306 2. HLD -T chol 78, HDL 40, LDL 23, TG 63    ROS: All other ROS reviewed and negative. Pertinent positives noted in the HPI.     Studies Reviewed: SABRA   EKG Interpretation Date/Time:  Monday April 25 2024 13:52:12 EDT Ventricular Rate:  66 PR Interval:  170 QRS Duration:  86 QT Interval:  366 QTC Calculation: 383 R Axis:   14  Text  Interpretation: Normal sinus rhythm with sinus arrhythmia Normal ECG Confirmed by Barbaraann Kotyk [phone]) on 04/25/2024 1:55:12 PM   Physical Exam:   VS:  BP 124/80 (BP Location: Left Arm, Patient Position: Sitting, Cuff Size: Normal)   Pulse 61   Resp 16   Ht 5' 7 (1.702 m)   Wt 183 lb (83 kg)   SpO2 98%   BMI 28.66 kg/m    Wt Readings from Last 3 Encounters:  04/25/24 183 lb (83 kg)  08/05/23 183 lb 9.6 oz (83.3 kg)  03/26/23 181 lb 6.4 oz (82.3 kg)    GEN: Well nourished, well developed in no acute distress NECK: No JVD; No carotid bruits CARDIAC: RRR, no murmurs, rubs, gallops RESPIRATORY:  Clear to auscultation without rales, wheezing or rhonchi  ABDOMEN: Soft, non-tender, non-distended EXTREMITIES:  No edema; No deformity  ASSESSMENT AND PLAN: .   Assessment and Plan    Inferior STEMI (June 2024) Status post inferior STEMI in June 2024. Completed one year of DAPT. Asymptomatic with no angina. EKG normal sinus rhythm, no acute ischemic changes. Blood pressure well controlled. - Continue aspirin  indefinitely. - Follow up in one year unless symptoms develop.  Hyperlipidemia with elevated lipoprotein(a) LDL cholesterol controlled at 23 mg/dL on Repatha , Lipitor, and Zetia . Discussed future lipid management options, including pill version of Repatha  and emerging therapies. Informed about potential new treatments and insurance coverage. - Continue Repatha , Lipitor 80 mg daily, and Zetia   10 mg daily. - Monitor for new treatment options and discuss as available.  Hypertension Hypertension well controlled. Blood pressure 124/80 mmHg. - Continue metoprolol  succinate 12.5 mg daily.              Follow-up: Return in about 1 year (around 04/25/2025).  Signed, Darryle DASEN. Barbaraann, MD, [facility]  [facility]  [address] [place], [place] [address] [phone]  2:07 PM

## 2024-04-25 ENCOUNTER — Encounter: Payer: Self-pay | Admitting: Cardiovascular Disease

## 2024-04-25 ENCOUNTER — Ambulatory Visit: Attending: Cardiovascular Disease | Admitting: Cardiovascular Disease

## 2024-04-25 VITALS — BP 124/80 | HR 61 | Resp 16 | Ht 67.0 in | Wt 183.0 lb

## 2024-04-25 DIAGNOSIS — I2510 Atherosclerotic heart disease of native coronary artery without angina pectoris: Secondary | ICD-10-CM | POA: Diagnosis not present

## 2024-04-25 DIAGNOSIS — E782 Mixed hyperlipidemia: Secondary | ICD-10-CM | POA: Diagnosis not present

## 2024-04-25 NOTE — Patient Instructions (Signed)
 Medication Instructions:  Your physician recommends that you continue on your current medications as directed. Please refer to the Current Medication list given to you today.  *If you need a refill on your cardiac medications before your next appointment, please call your pharmacy*   Follow-Up: At [facility], you and your health needs are our priority.  As part of our continuing mission to provide you with exceptional heart care, our providers are all part of one team.  This team includes your primary Cardiologist (physician) and Advanced Practice Providers or APPs (Physician Assistants and Nurse Practitioners) who all work together to provide you with the care you need, when you need it.  Your next appointment:   1 year(s)  Provider:   One of our Advanced Practice Providers (APPs): Morse Clause, PA-C  Lamarr Satterfield, NP Miriam Shams, NP  Olivia Pavy, PA-C Josefa Beauvais, NP  Leontine Salen, PA-C Orren Fabry, PA-C  [place], [place] Jackee Alberts, NP  Damien Braver, NP Jon Hails, PA-C  Waddell Donath, PA-C    Dayna Dunn, PA-C  Glendia Ferrier, PA-C Lum Louis, NP Katlyn West, NP Callie Goodrich, PA-C  Evan Williams, PA-C Sheng Haley, PA-C  Xika Zhao, NP Kathleen Johnson, PA-C

## 2024-05-03 ENCOUNTER — Other Ambulatory Visit: Payer: Self-pay | Admitting: Cardiovascular Disease

## 2024-07-05 ENCOUNTER — Other Ambulatory Visit: Payer: Self-pay | Admitting: Cardiovascular Disease

## 2024-08-05 ENCOUNTER — Other Ambulatory Visit: Payer: Self-pay | Admitting: Adult Health

## 2024-08-23 ENCOUNTER — Other Ambulatory Visit (HOSPITAL_COMMUNITY): Payer: Self-pay

## 2024-08-23 ENCOUNTER — Telehealth: Payer: Self-pay

## 2024-08-23 DIAGNOSIS — E782 Mixed hyperlipidemia: Secondary | ICD-10-CM

## 2024-08-23 NOTE — Telephone Encounter (Signed)
 Pharmacy Patient Advocate Encounter   Received notification from Fax that prior authorization for REPATHA  is required/requested.   Insurance verification completed.   The patient is insured through [employer].   Per test claim: PA required; PA started via CoverMyMeds. KEY NA . Please see clinical question(s) below that I am not finding the answer to in their chart and advise.   PLAN REQUIRES LDL WITHIN THE LAST 6 MONTHS FOR RENEWAL, PLEASE ADVISE

## 2024-08-23 NOTE — Telephone Encounter (Signed)
 Spoke with patient regarding prior auth needed from insurance and plan requiring LDL cholesterol within last 6 months to refill.  Patient has not had any labs drawn since April. Order for Lipid panel placed, patient will complete fasting labs at his earliest convenience.

## 2024-08-24 ENCOUNTER — Other Ambulatory Visit: Payer: Self-pay | Admitting: Cardiovascular Disease

## 2024-08-24 DIAGNOSIS — E782 Mixed hyperlipidemia: Secondary | ICD-10-CM

## 2024-08-24 DIAGNOSIS — I2119 ST elevation (STEMI) myocardial infarction involving other coronary artery of inferior wall: Secondary | ICD-10-CM

## 2024-08-24 DIAGNOSIS — I2510 Atherosclerotic heart disease of native coronary artery without angina pectoris: Secondary | ICD-10-CM

## 2024-08-29 DIAGNOSIS — E782 Mixed hyperlipidemia: Secondary | ICD-10-CM | POA: Diagnosis not present

## 2024-08-29 LAB — LIPID PANEL
Chol/HDL Ratio: 2.3 ratio (ref 0.0–5.0)
Cholesterol, Total: 89 mg/dL — ABNORMAL LOW (ref 100–199)
HDL: 38 mg/dL — ABNORMAL LOW (ref >39)
LDL Chol Calc (NIH): 31 mg/dL (ref 0–99)
Triglycerides: 108 mg/dL (ref 0–149)
VLDL Cholesterol Cal: 20 mg/dL (ref 5–40)

## 2024-08-30 ENCOUNTER — Encounter: Payer: Self-pay | Admitting: Cardiovascular Disease

## 2024-08-30 ENCOUNTER — Ambulatory Visit: Payer: Self-pay | Admitting: Cardiovascular Disease

## 2024-10-03 ENCOUNTER — Encounter: Payer: Self-pay | Admitting: Cardiovascular Disease
